# Patient Record
Sex: Female | Born: 1995 | Race: White | Hispanic: No | Marital: Single | Smoking: Former smoker
Health system: Southern US, Community
[De-identification: ages and names within clinical notes are randomized; demographics above are authoritative.]

## PROBLEM LIST (undated history)

## (undated) HISTORY — PX: FOOT SURGERY: SHX648

---
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
## 2017-01-06 ENCOUNTER — Other Ambulatory Visit: Payer: Medicaid Other

## 2017-01-14 ENCOUNTER — Ambulatory Visit: Payer: Medicaid Other | Admitting: Family Medicine

## 2017-02-07 ENCOUNTER — Other Ambulatory Visit: Payer: Self-pay | Admitting: Family Medicine

## 2017-02-08 NOTE — Telephone Encounter (Signed)
Can we schedule her for follow up regarding mood?

## 2017-02-09 NOTE — Telephone Encounter (Signed)
Spoke with patient and she is aware of script being sent to the pharmacy but states that her medicaid has expired and unsure of when she will get it back.  Patient will keep [place] updated on her insurance.  Marissa Wright,CMA

## 2017-10-10 ENCOUNTER — Encounter: Payer: Self-pay | Admitting: Family Medicine

## 2017-10-10 ENCOUNTER — Other Ambulatory Visit: Payer: Self-pay

## 2017-10-10 ENCOUNTER — Ambulatory Visit (INDEPENDENT_AMBULATORY_CARE_PROVIDER_SITE_OTHER): Payer: Self-pay | Admitting: Family Medicine

## 2017-10-10 VITALS — BP 92/60 | HR 69 | Temp 98.1°F | Wt 109.0 lb

## 2017-10-10 DIAGNOSIS — Z1159 Encounter for screening for other viral diseases: Secondary | ICD-10-CM

## 2017-10-10 DIAGNOSIS — Z789 Other specified health status: Secondary | ICD-10-CM

## 2017-10-10 DIAGNOSIS — IMO0001 Reserved for inherently not codable concepts without codable children: Secondary | ICD-10-CM | POA: Insufficient documentation

## 2017-10-10 LAB — POCT URINE PREGNANCY: PREG TEST UR: NEGATIVE

## 2017-10-10 MED ORDER — IBUPROFEN 800 MG PO TABS: 800.0000 mg | ORAL_TABLET | Freq: Three times a day (TID) | ORAL | 3 refills | Status: DC | PRN

## 2017-10-10 MED ORDER — TETANUS-DIPHTH-ACELL PERTUSSIS 5-2-15.5 LF-MCG/0.5 IM SUSP: 0.5000 mL | Freq: Once | INTRAMUSCULAR | 0 refills | Status: AC

## 2017-10-10 MED ORDER — DESOGESTREL-ETHINYL ESTRADIOL 0.15-30 MG-MCG PO TABS: 1.0000 | ORAL_TABLET | Freq: Every day | ORAL | 0 refills | Status: DC

## 2017-10-10 NOTE — Progress Notes (Signed)
    Subjective:    Patient ID: Marissa Wright, female    DOB: [birth year], 22 y.o.   MRN: [id number]   CC: restart birth control  HPI: Patient coming in today to restart birth control for severe menstrual cramping. She would like to reduce the amount of cramping and pain she has with each period. She tried sprintec last but had significant nausea and mood swings with this medication. Her health insurance lapsed and she was unable to follow up.   She is no longer taking any psych medications and is not interested in restarting these at this time.   Denies pelvic pain, vaginal bleeding, vaginal discharge.   Smoking status reviewed- non-smoker  Review of Systems- see HPI   Objective:  BP 92/60   Pulse 69   Temp 98.1 F (36.7 C) (Oral)   Wt 109 lb (49.4 kg)   LMP 09/30/2017   SpO2 99%   BMI 19.31 kg/m  Vitals and nursing note reviewed  General: well nourished, in no acute distress HEENT: normocephalic, MMM Cardiac: RRR, clear S1 and S2, no murmurs, rubs, or gallops Respiratory: clear to auscultation bilaterally, no increased work of breathing Extremities: no edema or cyanosis. Neuro: alert and oriented, no focal deficits   Assessment & Plan:    Birth control  Upreg negative. rx sent in for desogestrel-ethinyl estradiol at lower dose of estradiol to hopefully decrease nausea side effect. Asked patient to take with food. She can take continuously, skip placebo week to limit amount of periods. Asked her to call if she has side effects with this medication. Could try other formulation or another route of birth control in future. Ibuprofen refilled for cramping.  Screening for viral disease  Due for HIV screening, will obtain today.    Return as needed.   Lucila Maine, DO Family Medicine Resident PGY-3

## 2017-10-10 NOTE — Assessment & Plan Note (Signed)
  Upreg negative. rx sent in for desogestrel-ethinyl estradiol at lower dose of estradiol to hopefully decrease nausea side effect. Asked patient to take with food. She can take continuously, skip placebo week to limit amount of periods. Asked her to call if she has side effects with this medication. Could try other formulation or another route of birth control in future. Ibuprofen refilled for cramping.

## 2017-10-10 NOTE — Patient Instructions (Addendum)
It was great seeing you today!  Please let me know if you tolerate the new birth control medication ok and I'll send in refills. If not there are other forms of the pill we can try or other forms of birth control we could use as well.  If you have questions or concerns please do not hesitate to call at [phone].  Lucila Maine, DO PGY-3, [facility] 10/10/2017 2:48 PM   Hormonal Contraception Information Hormonal contraception is a type of birth control that uses hormones to prevent pregnancy. It usually involves a combination of the hormones estrogen and progesterone or only the hormone progesterone. Hormonal contraception works in these ways:  It thickens the mucus in the cervix, making it harder for sperm to enter the uterus.  It changes the lining of the uterus, making it harder for an egg to implant.  It may stop the ovaries from releasing eggs (ovulation). Some women who take hormonal contraceptives that contain only progesterone may continue to ovulate.  Hormonal contraception cannot prevent sexually transmitted infections (STIs). Pregnancy may still occur. Estrogen and progesterone contraceptives Contraceptives that use a combination of estrogen and progesterone are available in these forms:  Pill. Pills come in different combinations of hormones. They must be taken at the same time each day. Pills can affect your period, causing you to get your period once every three months or not at all.  Patch. The patch must be worn on the lower abdomen for three weeks and then removed on the fourth.  Vaginal ring. The ring is placed in the vagina and left there for three weeks. It is then removed for one week.  Progesterone contraceptives Contraceptives that use progesterone only are available in these forms:  Pill. Pills should be taken every day of the cycle.  Intrauterine device (IUD). This device is inserted into the uterus and removed or replaced every five  years or sooner.  Implant. Plastic rods are placed under the skin of the upper arm. They are removed or replaced every three years or sooner.  Injection. The injection is given once every 90 days.  What are the side effects? The side effects of estrogen and progesterone contraceptives include:  Nausea.  Headaches.  Breast tenderness.  Bleeding or spotting between menstrual cycles.  High blood pressure (rare).  Strokes, heart attacks, or blood clots (rare)  Side effects of progesterone-only contraceptives include:  Nausea.  Headaches.  Breast tenderness.  Unpredictable menstrual bleeding.  High blood pressure (rare).  Talk to your health care provider about what side effects may affect you. Where to find more information:  Ask your health care provider for more information and resources about hormonal contraception.  U.S. Department of Health and Programmer, systems on Women's Health: [place] Questions to ask:  What type of hormonal contraception is right for me?  How long should I plan to use hormonal contraception?  What are the side effects of the hormonal contraception method I choose?  How can I prevent STIs while using hormonal contraception? Contact a health care provider if:  You start taking hormonal contraceptives and you develop persistent or severe side effects. Summary  Estrogen and progesterone are hormones used in many forms of birth control.  Talk to your health care provider about what side effects may affect you.  Hormonal contraception cannot prevent sexually transmitted infections (STIs).  Ask your health care provider for more information and resources about hormonal contraception. This information is not intended to replace advice  given to you by your health care provider. Make sure you discuss any questions you have with your health care provider. Document Released: 01/17/2007 Document Revised: 11/28/2015 Document  Reviewed: 11/28/2015 Elsevier Interactive Patient Education  [employer].

## 2017-10-10 NOTE — Assessment & Plan Note (Signed)
  Due for HIV screening, will obtain today.

## 2017-10-11 ENCOUNTER — Encounter: Payer: Self-pay | Admitting: *Deleted

## 2017-10-11 LAB — HIV ANTIBODY (ROUTINE TESTING W REFLEX): HIV Screen 4th Generation wRfx: NONREACTIVE

## 2017-10-11 NOTE — Progress Notes (Signed)
Please inform patient HIV screen negative.

## 2017-12-29 ENCOUNTER — Other Ambulatory Visit: Payer: Self-pay | Admitting: Family Medicine

## 2018-03-30 ENCOUNTER — Other Ambulatory Visit: Payer: Self-pay | Admitting: Family Medicine

## 2018-06-18 ENCOUNTER — Other Ambulatory Visit: Payer: Self-pay | Admitting: Family Medicine

## 2019-03-09 ENCOUNTER — Other Ambulatory Visit: Payer: Self-pay

## 2019-03-09 ENCOUNTER — Ambulatory Visit (INDEPENDENT_AMBULATORY_CARE_PROVIDER_SITE_OTHER): Payer: 59 | Admitting: Family Medicine

## 2019-03-09 ENCOUNTER — Encounter: Payer: Self-pay | Admitting: Family Medicine

## 2019-03-09 VITALS — BP 92/60 | HR 90 | Temp 98.6°F | Wt 113.0 lb

## 2019-03-09 DIAGNOSIS — Z30018 Encounter for initial prescription of other contraceptives: Secondary | ICD-10-CM | POA: Diagnosis not present

## 2019-03-09 DIAGNOSIS — L03317 Cellulitis of buttock: Secondary | ICD-10-CM | POA: Diagnosis not present

## 2019-03-09 DIAGNOSIS — L300 Nummular dermatitis: Secondary | ICD-10-CM | POA: Diagnosis not present

## 2019-03-09 DIAGNOSIS — L0390 Cellulitis, unspecified: Secondary | ICD-10-CM | POA: Insufficient documentation

## 2019-03-09 LAB — POCT URINE PREGNANCY: Preg Test, Ur: NEGATIVE

## 2019-03-09 MED ORDER — DESOGESTREL-ETHINYL ESTRADIOL 0.15-30 MG-MCG PO TABS: 1.0000 | ORAL_TABLET | Freq: Every day | ORAL | 3 refills | Status: DC

## 2019-03-09 MED ORDER — MUPIROCIN 2 % EX OINT: 1.0000 "application " | TOPICAL_OINTMENT | Freq: Two times a day (BID) | CUTANEOUS | 0 refills | Status: DC

## 2019-03-09 MED ORDER — TRIAMCINOLONE ACETONIDE 0.1 % EX OINT: 1.0000 "application " | TOPICAL_OINTMENT | Freq: Two times a day (BID) | CUTANEOUS | 0 refills | Status: DC

## 2019-03-09 NOTE — Assessment & Plan Note (Signed)
Assessment: Small area of erythema surrounding central pimple-like lesion present on left buttock for few days.  Patient without systemic symptoms and lesion without signs of fluid collection/abscess. Plan: -Recommended patient/patient mother draw a line around the exterior of the lesion to make sure it is not spreading and continue to monitor for any fluid collections. -We will prescribe Bactroban which patient can use if lesion seems to be more painful or worsening -Return precautions provided

## 2019-03-09 NOTE — Progress Notes (Signed)
   Subjective:    Patient ID: Marissa Wright, female    DOB: 10/06/1995, 24 y.o.   MRN: [id number]   CC: Refill contraception/rash  HPI:  Contraception: Patient states that she was on OCPs primarily for painful menses and acne over the past several months.  She unfortunately ran out of these about a month ago.  Patient presents with her mother who both states that the patient is not currently sexually active as her boyfriend is in jail.  Patient states that at this time she primarily would like to restart her previous OCP as she feels like her acne is worse over this past month after not using it and that she often has painful periods.  Patient denies history of migraines.  Patient states that she does not have any difficulty taking medication on a regular basis and does not worry about potentially forgetting to take her medication.     Rash: Scaly rash present on inferior aspect of right arm since last Saturday.  Patient states she has a history of eczema and has previously had similar lesions on her other limbs that sometimes take several months to resolve.  She denies pain but does state this lesion is itchy.  She has not currently tried anything to help improve this rash at this time. Patient also states she has a area on her left hip that is erythematous and somewhat painful that started after switching from sweatpants to jeans which are required at her new job.  She presents with a photo of this lesion and states that there is no signs of fluctuance/drainage/abscess or fluid collection.  Denies chills or fevers.   ROS: pertinent noted in the HPI   Pertinent PMH, PSH, FH, SoHx: Hx of eczema  Objective:  BP 92/60   Pulse 90   Temp 98.6 F (37 C) (Oral)   Wt 113 lb (51.3 kg)   LMP 02/11/2019   SpO2 98%   BMI 20.02 kg/m   Vitals and nursing note reviewed  General: NAD, pleasant, able to participate in exam Skin/extremities: Scaly rash present on inferior aspect of right upper arm  approximately 2 cm x 2 cm.  Patient with photo of erythematous lesion from left buttock with central raised area similar to a pimple.  Psych: Normal affect and mood   Assessment & Plan:    Nummular eczema Patient with a history of eczema with new lesion on left inferior arm.  KOH negative. Plan: -We will prescribe triamcinolone 0.1% ointment  Cellulitis Assessment: Small area of erythema surrounding central pimple-like lesion present on left buttock for few days.  Patient without systemic symptoms and lesion without signs of fluid collection/abscess. Plan: -Recommended patient/patient mother draw a line around the exterior of the lesion to make sure it is not spreading and continue to monitor for any fluid collections. -We will prescribe Bactroban which patient can use if lesion seems to be more painful or worsening -Return precautions provided  Contraception management Plan: -Refilled patient's OCPs -Pregnancy test negative     Lurline Del, [facility] PGY-1

## 2019-03-09 NOTE — Patient Instructions (Addendum)
It was great to see you!  Our plans for today:  - I provided a refill for your birth control medication. -For your eczema I am prescribing a triamcinolone ointment.  You can use this twice per day on the rash on your arm. -For the rash on your hip I recommend simply drawing a line around the age of the redness and monitor it for any pus or swelling.  I am prescribing some Bactroban ointment which he can use on this area if it does not seem to be improving over the next 1-2 days.  Take care and seek immediate care sooner if you develop any concerns.   Dr[facility]

## 2019-03-09 NOTE — Assessment & Plan Note (Signed)
Patient with a history of eczema with new lesion on left inferior arm.  KOH negative. Plan: -We will prescribe triamcinolone 0.1% ointment

## 2019-03-09 NOTE — Assessment & Plan Note (Signed)
Plan: -Refilled patient's OCPs -Pregnancy test negative

## 2019-03-16 ENCOUNTER — Ambulatory Visit (INDEPENDENT_AMBULATORY_CARE_PROVIDER_SITE_OTHER): Payer: 59 | Admitting: Family Medicine

## 2019-03-16 ENCOUNTER — Telehealth: Payer: Self-pay | Admitting: Family Medicine

## 2019-03-16 ENCOUNTER — Other Ambulatory Visit (HOSPITAL_COMMUNITY)
Admission: RE | Admit: 2019-03-16 | Discharge: 2019-03-16 | Disposition: A | Discharge status: Home / Self Care | Payer: 59 | Source: Ambulatory Visit | Attending: Family Medicine | Admitting: Family Medicine

## 2019-03-16 ENCOUNTER — Other Ambulatory Visit: Payer: Self-pay

## 2019-03-16 ENCOUNTER — Other Ambulatory Visit: Payer: Self-pay | Admitting: Family Medicine

## 2019-03-16 VITALS — BP 110/58 | HR 87 | Ht 63.0 in | Wt 115.0 lb

## 2019-03-16 DIAGNOSIS — F39 Unspecified mood [affective] disorder: Secondary | ICD-10-CM | POA: Diagnosis not present

## 2019-03-16 DIAGNOSIS — Z202 Contact with and (suspected) exposure to infections with a predominantly sexual mode of transmission: Secondary | ICD-10-CM | POA: Diagnosis not present

## 2019-03-16 DIAGNOSIS — Z124 Encounter for screening for malignant neoplasm of cervix: Secondary | ICD-10-CM | POA: Diagnosis present

## 2019-03-16 DIAGNOSIS — N898 Other specified noninflammatory disorders of vagina: Secondary | ICD-10-CM | POA: Insufficient documentation

## 2019-03-16 DIAGNOSIS — Z114 Encounter for screening for human immunodeficiency virus [HIV]: Secondary | ICD-10-CM

## 2019-03-16 DIAGNOSIS — Z113 Encounter for screening for infections with a predominantly sexual mode of transmission: Secondary | ICD-10-CM

## 2019-03-16 DIAGNOSIS — Z01419 Encounter for gynecological examination (general) (routine) without abnormal findings: Secondary | ICD-10-CM

## 2019-03-16 LAB — POCT WET PREP (WET MOUNT)
Clue Cells Wet Prep Whiff POC: NEGATIVE
Trichomonas Wet Prep HPF POC: ABSENT

## 2019-03-16 NOTE — Progress Notes (Signed)
SUBJECTIVE:   CHIEF COMPLAINT / HPI:  Vaginal Discharge The patient's primary symptoms include vaginal discharge. The patient's pertinent negatives include no genital itching or vaginal bleeding. Primary symptoms comment: On going for a few days, but she feels this is normal discharge. She is more concern about bumps on her vulva which has been there for a month. Episode onset: Bumps started about 1 month ago, but she noticed normal vaginal discharge few days ago. The problem has been unchanged. She is not pregnant (LMP was last week in january. She is on OCP for birth control and she said she had a negative pregnancy test 1 week ago.). Pertinent negatives include no abdominal pain, constipation, diarrhea, discolored urine, dysuria, fever, flank pain, frequency or vomiting. The vaginal discharge was normal. There has been no bleeding. Nothing aggravates the symptoms. She has tried nothing for the symptoms. Sexual activity: Last sexual activity was a year ago. She uses oral contraceptives for contraception. Her menstrual history has been regular.  Anxiety/Depression: No current suicidal thought. Last thought was 1 week. No previous suicide attempt. She does not think she will ever kill herself. She has SI only when she is having a bad day. SI thought is less frequent like less than once every 3-6 months. She has hx of anxiety. She had never been on meds and prefers not to be on meds.  PERTINENT  PMH / PSH: PMX reviewed.  OBJECTIVE:   BP (!) 110/58   Pulse 87   Ht '5\' 3"'$  (1.6 m)   Wt 115 lb (52.2 kg)   LMP 02/11/2019   SpO2 99%   BMI 20.37 kg/m    Physical Exam Vitals and nursing note reviewed. Exam conducted with a chaperone present Cherrie Distance Leggette chaperoned).  Cardiovascular:     Rate and Rhythm: Normal rate and regular rhythm.     Heart sounds: Normal heart sounds. No murmur.  Pulmonary:     Effort: Pulmonary effort is normal. No respiratory distress.     Breath sounds: Normal  breath sounds. No wheezing.  Abdominal:     General: Abdomen is flat. Bowel sounds are normal.     Palpations: Abdomen is soft.     Tenderness: There is no abdominal tenderness.  Genitourinary:    Vagina: No erythema or bleeding.     Cervix: No cervical motion tenderness.     Uterus: Normal.      Adnexa: Right adnexa normal and left adnexa normal.       Comments: Normal color vaginal discharge Neurological:     Mental Status: She is alert.  Psychiatric:        Attention and Perception: Attention normal.        Mood and Affect: Mood normal.        Speech: Speech normal.        Behavior: Behavior normal.        Thought Content: Thought content normal.        Judgment: Judgment normal.    GAD score: 17    Office Visit from 03/16/2019 in [place]  PHQ-9 Total Score  15       ASSESSMENT/PLAN:   Encounter for gynecological examination She is due for PAP. Gyn exam normal. GC/Chlamydia collected with her PAP. I will contact her with the result.  Vaginal lesion Vaginal discharge appears normal. Wet prep completed. HSV swab done and ordered with PAP given tiny bumps. As discussed with her, the bumps looks like ingrown hair  which will improve with avoiding shaving. She requested STD screen although she is not sexually active. HIV,RPR, GC/Chlamydia checked. I will contact with all test results.  Note: LMP was more than 1 month ago. Upreg recommended. However, she said she had a recent neg text. I reviewed her report, this is true. She will return soon for f/u if she still does not have her period.  Mood disorder ([place]) Currently, she is not suicidal. Hx of anxiety without treatment. I offered both pharmacologic treatment and therapy, but she declined both. She said she is fine and dealing with this by herself. I consulted with Dr. Hartford Poli, who met with the patient today. Return precaution discussed. She is safe for home d/c with no concern for harm  to herself or others at the moment.    More than 50% of this 35 minute face to face encounter was spent on counseling and coordination of care with [facility].  Andrena Mews, MD [place]

## 2019-03-16 NOTE — Assessment & Plan Note (Signed)
Vaginal discharge appears normal. Wet prep completed. HSV swab done and ordered with PAP given tiny bumps. As discussed with her, the bumps looks like ingrown hair which will improve with avoiding shaving. She requested STD screen although she is not sexually active. HIV,RPR, GC/Chlamydia checked. I will contact with all test results.  Note: LMP was more than 1 month ago. Upreg recommended. However, she said she had a recent neg text. I reviewed her report, this is true. She will return soon for f/u if she still does not have her period.

## 2019-03-16 NOTE — Assessment & Plan Note (Signed)
Currently, she is not suicidal. Hx of anxiety without treatment. I offered both pharmacologic treatment and therapy, but she declined both. She said she is fine and dealing with this by herself. I consulted with Dr. Hartford Poli, who met with the patient today. Return precaution discussed. She is safe for home d/c with no concern for harm to herself or others at the moment.

## 2019-03-16 NOTE — Progress Notes (Signed)
I was consulted by Dr. Gwendlyn Deutscher for positive PHQ-9 Pt verbally consented to speaking with me today.  S: Pt reported she experiences anxiety more than depression.  Pt reported she has been trying to push herself more to do things like get a new job and has been successful. Pt stated she tries to deal with her mental health on her own.  Pt reported she does have thoughts of SI and sometimes thinks about taking pills.  Pt denied intent.    Pt reported she took medication for her anxiety before and did not feel it worked.  Pt reports depression runs in her family.     Pt and Dr. Hartford Poli talked about coping strategies for anxiety such as mindfulness.  O: Pt engaged during conversation. Pt oriented X4.  Pt scored 15 on PHQ-9 with positive on question 9.  A: Pt would benefit from coping strategies and therapy in the future.  P: Patient was given suicide hotline number and encouraged to call [facility] if interested in therapy with Dr. Hartford Poli

## 2019-03-16 NOTE — Assessment & Plan Note (Signed)
She is due for PAP. Gyn exam normal. GC/Chlamydia collected with her PAP. I will contact her with the result.

## 2019-03-16 NOTE — Telephone Encounter (Signed)
I discussed wet prep result with her. It also appears that she did not go to the lab for HIV and RPR test. She will call to schedule lab appointment when she is able to come in. As discussed with her, I have put a future order for this. She verbalized understanding.

## 2019-03-16 NOTE — Patient Instructions (Signed)
Suicidal Feelings: How to Help Yourself Suicide is when you end your own life. There are many things you can do to help yourself feel better when struggling with these feelings. Many services and people are available to support you and others who struggle with similar feelings.  If you ever feel like you may hurt yourself or others, or have thoughts about taking your own life, get help right away. To get help:  Call your local emergency services (911 in the [place]  The [employer]'s health and human services helpline (211 in the [place]  Go to your nearest emergency department.  Call a suicide hotline to speak with a trained counselor. The following suicide hotlines are available in the [place]: ? 1-800-273-TALK (1-800-273-8255). ? 1-800-SUICIDE (1-800-784-2433). ? 1-888-628-9454. This is a hotline for Spanish speakers. ? 1-800-799-4889. This is a hotline for TTY users. ? 1-866-4-U-TREVOR (1-866-488-7386). This is a hotline for lesbian, gay, bisexual, transgender, or questioning youth. ? For a list of hotlines in [place], visit www.suicide.org/hotlines/international/[place]-suicide-hotlines.html  Contact a crisis center or a local suicide prevention center. To find a crisis center or suicide prevention center: ? Call your local hospital, clinic, community service organization, mental health center, social service provider, or health department. Ask for help with connecting to a crisis center. ? For a list of crisis centers in the [place], visit: suicidepreventionlifeline.org ? For a list of crisis centers in [place], visit: suicideprevention.ca How to help yourself feel better   Promise yourself that you will not do anything extreme when you have suicidal feelings. Remember, there is hope. Many people have gotten through suicidal thoughts and feelings, and you can too. If you have had these feelings before, remind yourself that you can get through them again.  Let family, friends,  teachers, or counselors know how you are feeling. Try not to separate yourself from those who care about you and want to help you. Talk with someone every day, even if you do not feel sociable. Face-to-face conversation is best to help them understand your feelings.  Contact a mental health care provider and work with this person regularly.  Make a safety plan that you can follow during a crisis. Include phone numbers of suicide prevention hotlines, mental health professionals, and trusted friends and family members you can call during an emergency. Save these numbers on your phone.  If you are thinking of taking a lot of medicine, give your medicine to someone who can give it to you as prescribed. If you are on antidepressants and are concerned you will overdose, tell your health care provider so that he or she can give you safer medicines.  Try to stick to your routines. Follow a schedule every day. Make self-care a priority.  Make a list of realistic goals, and cross them off when you achieve them. Accomplishments can give you a sense of worth.  Wait until you are feeling better before doing things that you find difficult or unpleasant.  Do things that you have always enjoyed to take your mind off your feelings. Try reading a book, or listening to or playing music. Spending time outside, in nature, may help you feel better. Follow these instructions at home:   Visit your primary health care provider every year for a checkup.  Work with a mental health care provider as needed.  Eat a well-balanced diet, and eat regular meals.  Get plenty of rest.  Exercise if you are able. Just 30 minutes of exercise each day can   help you feel better.  Take over-the-counter and prescription medicines only as told by your health care provider. Ask your mental health care provider about the possible side effects of any medicines you are taking.  Do not use alcohol or drugs, and remove these substances  from your home.  Remove weapons, poisons, knives, and other deadly items from your home. General recommendations  Keep your living space well lit.  When you are feeling well, write yourself a letter with tips and support that you can read when you are not feeling well.  Remember that life's difficulties can be sorted out with help. Conditions can be treated, and you can learn behaviors and ways of thinking that will help you. Where to find more information  National Suicide Prevention Lifeline: www.suicidepreventionlifeline.org  Hopeline: www.hopeline.com  American Foundation for Suicide Prevention: www.afsp.org  The Trevor Project (for lesbian, gay, bisexual, transgender, or questioning youth): www.thetrevorproject.org Contact a health care provider if:  You feel as though you are a burden to others.  You feel agitated, angry, vengeful, or have extreme mood swings.  You have withdrawn from family and friends. Get help right away if:  You are talking about suicide or wishing to die.  You start making plans for how to commit suicide.  You feel that you have no reason to live.  You start making plans for putting your affairs in order, saying goodbye, or giving your possessions away.  You feel guilt, shame, or unbearable pain, and it seems like there is no way out.  You are frequently using drugs or alcohol.  You are engaging in risky behaviors that could lead to death. If you have any of these symptoms, get help right away. Call emergency services, go to your nearest emergency department or crisis center, or call a suicide crisis helpline. Summary  Suicide is when you take your own life.  Promise yourself that you will not do anything extreme when you have suicidal feelings.  Let family, friends, teachers, or counselors know how you are feeling.  Get help right away if you feel as though life is getting too tough to handle and you are thinking about suicide. This  information is not intended to replace advice given to you by your health care provider. Make sure you discuss any questions you have with your health care provider. Document Revised: 04/20/2018 Document Reviewed: 08/10/2016 Elsevier Patient Education  2020 Elsevier Inc.  

## 2019-03-16 NOTE — Addendum Note (Signed)
Addended by: Katharina Caper, Swade Shonka D on: 03/16/2019 05:25 PM   Modules accepted: Orders

## 2019-03-19 LAB — SPECIMEN STATUS REPORT

## 2019-03-20 LAB — HERPES SIMPLEX VIRUS CULTURE

## 2019-03-22 ENCOUNTER — Telehealth: Payer: Self-pay | Admitting: Family Medicine

## 2019-03-22 LAB — CYTOLOGY - PAP
Chlamydia: NEGATIVE
Comment: NEGATIVE
Comment: NEGATIVE
Comment: NORMAL
Diagnosis: NEGATIVE
HSV1: NEGATIVE
HSV2: NEGATIVE
Neisseria Gonorrhea: NEGATIVE

## 2019-03-22 NOTE — Telephone Encounter (Signed)
Test result discussed. She is reminded to schedule lab appointment for HIV and RPR testing. She has not further questions.

## 2019-06-08 ENCOUNTER — Other Ambulatory Visit: Payer: Self-pay

## 2019-06-08 ENCOUNTER — Other Ambulatory Visit (HOSPITAL_COMMUNITY)
Admission: RE | Admit: 2019-06-08 | Discharge: 2019-06-08 | Disposition: A | Discharge status: Home / Self Care | Payer: 59 | Source: Ambulatory Visit | Attending: Family Medicine | Admitting: Family Medicine

## 2019-06-08 ENCOUNTER — Ambulatory Visit (INDEPENDENT_AMBULATORY_CARE_PROVIDER_SITE_OTHER): Payer: 59 | Admitting: Family Medicine

## 2019-06-08 VITALS — BP 102/62 | HR 94 | Temp 98.6°F | Wt 116.0 lb

## 2019-06-08 DIAGNOSIS — N898 Other specified noninflammatory disorders of vagina: Secondary | ICD-10-CM | POA: Diagnosis present

## 2019-06-08 DIAGNOSIS — B3731 Acute candidiasis of vulva and vagina: Secondary | ICD-10-CM | POA: Insufficient documentation

## 2019-06-08 DIAGNOSIS — F320 Major depressive disorder, single episode, mild: Secondary | ICD-10-CM

## 2019-06-08 DIAGNOSIS — B373 Candidiasis of vulva and vagina: Secondary | ICD-10-CM

## 2019-06-08 HISTORY — DX: Major depressive disorder, single episode, mild: F32.0

## 2019-06-08 LAB — POCT WET PREP (WET MOUNT)
Clue Cells Wet Prep Whiff POC: NEGATIVE
Trichomonas Wet Prep HPF POC: ABSENT

## 2019-06-08 MED ORDER — FLUCONAZOLE 150 MG PO TABS: 150.0000 mg | ORAL_TABLET | Freq: Once | ORAL | 0 refills | Status: AC

## 2019-06-08 NOTE — Progress Notes (Signed)
    SUBJECTIVE:   CHIEF COMPLAINT / HPI:   Vaginal discharge: Patient reports many days of vaginal burning and itching with associated agitation and feeling of "swelling".  She describes her vaginal discharge as 7 with a yellow tent.  She used Monistat over-the-counter vaginal yeast infection medication yesterday and feels a little bit better today.  She denies any fevers, chest pain, shortness of breath, chills, body aches, abdominal pain, or vaginal odors.  Would also like gonorrhea and Chlamydia testing.  Depression: patient scored 8 on PHQ-9 during today's visit, scored 0 on question 9.  Reports that she has a history of anxiety and depression and used to be on medications for such.  She is not currently on any medications for anxiety or depression now.  She has a strong family history on her mother side for anxiety, depression, and bipolar disorder.  She denies any SI or HI, confirm she is safe to herself and others.  PERTINENT  PMH / PSH:  Anxiety/depression Sexually active female   OBJECTIVE:   BP 102/62   Pulse 94   Temp 98.6 F (37 C) (Oral)   Wt 116 lb (52.6 kg)   LMP  (LMP Unknown)   SpO2 98%   BMI 20.55 kg/m    Physical exam: GU: Normal-appearing labia majora, erythema and edema appreciated to bilateral labia minora, normal appearing vaginal rugae with white discharge appreciated, normal-appearing cervix without lesion or bleeding    Office Visit from 06/08/2019 in [place]  PHQ-9 Total Score  8      ASSESSMENT/PLAN:   Vaginal candidiasis Wet prep performed today, with positive KOH and yeast, moderate bacteria, negative whiff and negative clue cells. -Treat with Diflucan 150 mg tablet x1  Depression, major, single episode, mild (HCC) Patient's PHQ-9 today is an 8, answered 0 on question #9.  Reports she is safe to herself and others. -At this time is respectfully declining any counseling or medication for depression -Was given resources  she can reach out to regarding behavioral health support -Was also encouraged to give our clinic a call should she find herself in a negative headspace, or if it severe enough to go to the emergency department.    [place]

## 2019-06-08 NOTE — Assessment & Plan Note (Signed)
Patient's PHQ-9 today is an 8, answered 0 on question #9.  Reports she is safe to herself and others. -At this time is respectfully declining any counseling or medication for depression -Was given resources she can reach out to regarding behavioral health support -Was also encouraged to give our clinic a call should she find herself in a negative headspace, or if it severe enough to go to the emergency department.

## 2019-06-08 NOTE — Assessment & Plan Note (Signed)
Wet prep performed today, with positive KOH and yeast, moderate bacteria, negative whiff and negative clue cells. -Treat with Diflucan 150 mg tablet x1

## 2019-06-12 LAB — CERVICOVAGINAL ANCILLARY ONLY
Chlamydia: NEGATIVE
Comment: NEGATIVE
Comment: NORMAL
Neisseria Gonorrhea: NEGATIVE

## 2019-07-10 ENCOUNTER — Other Ambulatory Visit: Payer: Self-pay | Admitting: Family Medicine

## 2019-08-29 ENCOUNTER — Encounter: Payer: Self-pay | Admitting: Family Medicine

## 2019-08-29 ENCOUNTER — Other Ambulatory Visit: Payer: Self-pay

## 2019-08-29 ENCOUNTER — Other Ambulatory Visit (HOSPITAL_COMMUNITY)
Admission: RE | Admit: 2019-08-29 | Discharge: 2019-08-29 | Disposition: A | Discharge status: Home / Self Care | Payer: 59 | Source: Ambulatory Visit | Attending: Family Medicine | Admitting: Family Medicine

## 2019-08-29 ENCOUNTER — Ambulatory Visit (INDEPENDENT_AMBULATORY_CARE_PROVIDER_SITE_OTHER): Payer: 59 | Admitting: Family Medicine

## 2019-08-29 VITALS — BP 98/60 | HR 93 | Wt 111.6 lb

## 2019-08-29 DIAGNOSIS — Z113 Encounter for screening for infections with a predominantly sexual mode of transmission: Secondary | ICD-10-CM | POA: Insufficient documentation

## 2019-08-29 DIAGNOSIS — B9689 Other specified bacterial agents as the cause of diseases classified elsewhere: Secondary | ICD-10-CM

## 2019-08-29 DIAGNOSIS — N760 Acute vaginitis: Secondary | ICD-10-CM | POA: Diagnosis not present

## 2019-08-29 LAB — POCT WET PREP (WET MOUNT)
Clue Cells Wet Prep Whiff POC: POSITIVE
Trichomonas Wet Prep HPF POC: ABSENT

## 2019-08-29 MED ORDER — METRONIDAZOLE 500 MG PO TABS: 500.0000 mg | ORAL_TABLET | Freq: Two times a day (BID) | ORAL | 0 refills | Status: AC

## 2019-08-29 NOTE — Assessment & Plan Note (Signed)
Present on wet prep and consistent with clinical presentation.  Rx Flagyl BID x 7 days.  Counseled on adequate hydration, avoiding tightfitting clothing/thong underwear, and sensitive soaps such as Dove (if needed) in vaginal region.  Encouraged barrier protection use when sexually active and recommended considering restarting contraception in the near future.  Will follow up additional STD screen as above.

## 2019-08-29 NOTE — Progress Notes (Signed)
    SUBJECTIVE:   CHIEF COMPLAINT / HPI: Vaginal discharge/STD testing  Marissa Wright is a 24 year old female presenting for evaluation of vaginal discharge.  Has noticed increased vaginal discharge with odor for the past few weeks.  Fishy at times, notices mild worse after sexual activity 1 month ago.  She tried a few days of vaginal boric acid with some improvement but returned shortly after discontinuing therapy.  Some vaginal irritation.  She denies any concurrent dysuria, fever, back pain.  Occasionally has had sharp suprapubic abdominal pains here and there.  No known exposures to STDs.  Has not had this before.  Contraception: None, discontinued OCPs 1 month ago since she was no longer sexually active.  Sexually active: Last >1 month ago, did not use barrier protection. Previous STD: None known. LMP: First week of August.  PERTINENT  PMH / PSH: Mood disorder, previous vaginal candidiasis  OBJECTIVE:   BP 98/60   Pulse 93   Wt 111 lb 9.6 oz (50.6 kg)   SpO2 98%   BMI 19.77 kg/m   General: Alert, NAD HEENT: NCAT, MMM Lungs: No increased WOB  Abdomen: soft, non-tender Ext: Warm, dry Pelvic exam: VULVA: normal appearing vulva with no masses, tenderness or lesions, VAGINA: vaginal discharge - white, copious and creamy, CERVIX: normal appearing cervix without discharge or lesions, cervical motion tenderness absent, UTERUS: uterus is normal size, shape, consistency and nontender, ADNEXA: normal adnexa in size, nontender and no masses.  Chaperoned by CMA.  ASSESSMENT/PLAN:   Screening examination for STD (sexually transmitted disease) Associated vaginal discharge as discussed below.  Will follow up gc/ch, trichomonas, HIV, RPR, and hep C.  Encouraged barrier protection use when sexually active to prevent transmission.  Bacterial vaginosis Present on wet prep and consistent with clinical presentation.  Rx Flagyl BID x 7 days.  Counseled on adequate hydration, avoiding tightfitting  clothing/thong underwear, and sensitive soaps such as Dove (if needed) in vaginal region.  Encouraged barrier protection use when sexually active and recommended considering restarting contraception in the near future.  Will follow up additional STD screen as above.    Follow-up if the above not improving or sooner if worsening, follow-up to discuss contraception.  Patriciaann Clan, [place]

## 2019-08-29 NOTE — Patient Instructions (Signed)
I have sent in Flagyl to help treat bacterial vaginosis, please do not drink any alcohol during use of this medication as this can make you quite nauseous.  Additionally to help prevent further episodes, please make sure that you are staying well-hydrated drinking plenty of water, avoid tightfitting clothing, wear cotton underwear and minimal use of thongs, and avoid douching or using soap within your vaginal canal.   I will follow up with you for the rest your results in the next several days.  Please make sure if you are sexually active again (wait 2 weeks), that you use condoms to help prevent from any sexually transmitted diseases or unwanted pregnancy.  Please consider restarting contraception if active.

## 2019-08-29 NOTE — Assessment & Plan Note (Signed)
Associated vaginal discharge as discussed below.  Will follow up gc/ch, trichomonas, HIV, RPR, and hep C.  Encouraged barrier protection use when sexually active to prevent transmission.

## 2019-08-30 ENCOUNTER — Encounter: Payer: Self-pay | Admitting: Family Medicine

## 2019-08-30 ENCOUNTER — Encounter (HOSPITAL_COMMUNITY): Payer: Self-pay | Admitting: Family Medicine

## 2019-08-30 LAB — HEPATITIS C ANTIBODY: Hep C Virus Ab: <0.1 s/co ratio (ref 0.0–0.9)

## 2019-08-30 LAB — CERVICOVAGINAL ANCILLARY ONLY
Chlamydia: NEGATIVE
Comment: NEGATIVE
Comment: NEGATIVE
Comment: NORMAL
Neisseria Gonorrhea: NEGATIVE
Trichomonas: NEGATIVE

## 2019-08-30 LAB — HIV ANTIBODY (ROUTINE TESTING W REFLEX): HIV Screen 4th Generation wRfx: NONREACTIVE

## 2019-08-30 LAB — RPR: RPR Ser Ql: NONREACTIVE

## 2019-11-17 ENCOUNTER — Other Ambulatory Visit: Payer: Self-pay | Admitting: Family Medicine

## 2019-11-22 ENCOUNTER — Other Ambulatory Visit (HOSPITAL_COMMUNITY)
Admission: RE | Admit: 2019-11-22 | Discharge: 2019-11-22 | Disposition: A | Discharge status: Home / Self Care | Payer: 59 | Source: Ambulatory Visit | Attending: Family Medicine | Admitting: Family Medicine

## 2019-11-22 ENCOUNTER — Other Ambulatory Visit: Payer: Self-pay

## 2019-11-22 ENCOUNTER — Ambulatory Visit (INDEPENDENT_AMBULATORY_CARE_PROVIDER_SITE_OTHER): Payer: 59 | Admitting: Family Medicine

## 2019-11-22 ENCOUNTER — Other Ambulatory Visit: Payer: Self-pay | Admitting: Family Medicine

## 2019-11-22 VITALS — BP 102/64 | HR 92 | Ht 62.0 in | Wt 111.8 lb

## 2019-11-22 DIAGNOSIS — N898 Other specified noninflammatory disorders of vagina: Secondary | ICD-10-CM

## 2019-11-22 DIAGNOSIS — Z3202 Encounter for pregnancy test, result negative: Secondary | ICD-10-CM | POA: Diagnosis not present

## 2019-11-22 DIAGNOSIS — R3915 Urgency of urination: Secondary | ICD-10-CM

## 2019-11-22 DIAGNOSIS — R102 Pelvic and perineal pain: Secondary | ICD-10-CM | POA: Insufficient documentation

## 2019-11-22 LAB — POCT UA - MICROSCOPIC ONLY

## 2019-11-22 LAB — POCT URINALYSIS DIP (MANUAL ENTRY)
Bilirubin, UA: NEGATIVE
Glucose, UA: NEGATIVE mg/dL
Leukocytes, UA: NEGATIVE
Nitrite, UA: NEGATIVE
Protein Ur, POC: NEGATIVE mg/dL
Spec Grav, UA: >=1.03 — AB (ref 1.010–1.025)
Urobilinogen, UA: 1 E.U./dL
pH, UA: 6.5 (ref 5.0–8.0)

## 2019-11-22 LAB — POCT URINE PREGNANCY: Preg Test, Ur: NEGATIVE

## 2019-11-22 MED ORDER — NAPROXEN SODIUM 550 MG PO TABS: 550.0000 mg | ORAL_TABLET | Freq: Two times a day (BID) | ORAL | 0 refills | Status: DC

## 2019-11-22 NOTE — Progress Notes (Signed)
    SUBJECTIVE:   CHIEF COMPLAINT / HPI: Pain  Patient reports that she has been experiencing vaginal pain for 1 week.  She states that she developed this pain on the right side of her labia after vigorous intercourse.  She reports no abnormal vaginal discharge or bleeding.  She states that the area is very tender to touch, painful when trying to sit for long periods of time.  She also reports having some increased urinary urgency since onset of this pain.  She denies any problems with having bowel movements.  She denies any fevers or chills recently.  PERTINENT  PMH / PSH:  Noncontributory  OBJECTIVE:   BP 102/64   Pulse 92   Ht 5\' 2"  (1.575 m)   Wt 111 lb 12.8 oz (50.7 kg)   LMP 10/31/2019 (Approximate)   SpO2 99%   BMI 20.45 kg/m   Physical exam Genitalia:  Normal introitus for age, no external lesions, no vaginal discharge, mucosa pink and moist, initially unable to tolerate speculum exam, bimanual exam with anterior vaginal wall swelling about 2 to 3 cm in diameter, speculum exam letter achieved by Dr. Owens Shark who reported edematous vaginal walls  ASSESSMENT/PLAN:   Vaginal wall cyst Swelling in anterior vaginal wall could be due to cyst or cystocele.  No drainage or bleeding notable on speculum exam today however patient experience intermittent tenderness with exam/visualization. -Referral to gynecology -Urine analysis completed today -Urine pregnancy test completed -We will test for gonorrhea and chlamydia     Eulis Foster, MD [place]

## 2019-11-22 NOTE — Patient Instructions (Addendum)
It was wonderful to see you today.  Please bring ALL of your medications with you to every visit.   Today we talked about:  - Calling Gynecology for an appointment  - Taking prenatal vitamins  - We will call you the results of your testing  - Taken Tylenol 1 tablet every 6 hours --regardless of pain ('standing') and as needed naproxen for pain--this  Is at your pharmacy    Thank you for choosing [facility].   Please call [phone] with any questions about today's appointment.  Please be sure to schedule follow up at the front  desk before you leave today.   Dorris Singh, MD  Family Medicine

## 2019-11-23 LAB — CERVICOVAGINAL ANCILLARY ONLY
Chlamydia: NEGATIVE
Comment: NEGATIVE
Comment: NORMAL
Neisseria Gonorrhea: NEGATIVE

## 2019-11-25 DIAGNOSIS — N898 Other specified noninflammatory disorders of vagina: Secondary | ICD-10-CM | POA: Insufficient documentation

## 2019-11-25 LAB — URINE CULTURE

## 2019-11-25 NOTE — Assessment & Plan Note (Signed)
Swelling in anterior vaginal wall could be due to cyst or cystocele.  No drainage or bleeding notable on speculum exam today however patient experience intermittent tenderness with exam/visualization. -Referral to gynecology -Urine analysis completed today -Urine pregnancy test completed -We will test for gonorrhea and chlamydia

## 2019-12-10 ENCOUNTER — Ambulatory Visit: Payer: 59 | Admitting: Obstetrics and Gynecology

## 2019-12-19 ENCOUNTER — Ambulatory Visit (INDEPENDENT_AMBULATORY_CARE_PROVIDER_SITE_OTHER): Payer: 59 | Admitting: Family Medicine

## 2019-12-19 ENCOUNTER — Other Ambulatory Visit: Payer: Self-pay

## 2019-12-19 VITALS — BP 112/80 | HR 81

## 2019-12-19 DIAGNOSIS — R112 Nausea with vomiting, unspecified: Secondary | ICD-10-CM | POA: Diagnosis not present

## 2019-12-19 DIAGNOSIS — R1110 Vomiting, unspecified: Secondary | ICD-10-CM | POA: Insufficient documentation

## 2019-12-19 MED ORDER — PROCHLORPERAZINE MALEATE 5 MG PO TABS: 5.0000 mg | ORAL_TABLET | Freq: Four times a day (QID) | ORAL | 0 refills | Status: DC | PRN

## 2019-12-19 MED ORDER — ONDANSETRON 4 MG PO TBDP: 4.0000 mg | ORAL_TABLET | Freq: Three times a day (TID) | ORAL | 0 refills | Status: DC | PRN

## 2019-12-19 MED ORDER — ONDANSETRON 4 MG PO TBDP
4.0000 mg | ORAL_TABLET | Freq: Once | ORAL | Status: AC
  Administered 2019-12-19: 4 mg via ORAL

## 2019-12-19 NOTE — Patient Instructions (Addendum)
It was a pleasure to see you today!  Thank you for choosing [facility] for your primary care.  Ermina Oberman was seen for nausea and vomiting.   Our plans for today were:  I have prescribed a medication for you to take to help with your nausea.  Is very important that you remain hydrated and continue to drink fluids.  I recommend slowly introducing solids back into your diet as tolerated.  Examples include dry toast, soup broths or saltine crackers.  If you began to have vomiting that does not respond to the medication and began to become dizzy with standing, you will need care.   If you continue to experience nausea and missed your next menstrual cycle, I recommend taking a home pregnancy test and following up with our office afterwards.   Best Wishes,   Dr. Alba Cory

## 2019-12-19 NOTE — Progress Notes (Signed)
    SUBJECTIVE:   CHIEF COMPLAINT / HPI: nausea and emesis   Patient reports being constantly nauseous and vomitting for 7 days  She has not been able to consistently keep food down due to the nausea/emesis.  She also reports having an upper respiratory infection  She is unaware of fevers  Does report chills  Has a "tickle in throat"  Has been coughing a lot  She was tested for covid and flu both results were negative on 12/15/19 at [facility]  Reports having diarrhea for the first few days  She reports feelings past of dizziness with standing earlier in her illness  Reports some palpitations  Denies similar symptoms in any sick contacts  Emesis looks like clear/yellow liquid without blood  Episodes of emesis have decreased  LMP was 21st of Nov  Had negative home pregnancy test two days ago  Has not been taking OCP regularly and currently sexually active without using barriers   PERTINENT  PMH / PSH:  Non contributory   OBJECTIVE:   BP 112/80   Pulse 81   LMP 12/03/2019   SpO2 98%   Physical exam General: female appearing stated age in no acute distress HEENT: MMM, no oral lesions noted,Neck non-tender without lymphadenopathy Cardio: Normal S1 and S2, no S3 or S4. Rhythm is regular. No murmurs or rubs.  Bilateral radial pulses palpable Pulm: Clear to auscultation bilaterally, no crackles, wheezing, or diminished breath sounds. Abdomen: Bowel sounds normal. Abdomen soft and non-tender during my exam.   ASSESSMENT/PLAN:   Emesis Nausea and emesis with differential including early pregnancy as well as a viral GI bug state.  Patient given 4 mg of ODT Zofran in clinic.  Did not note much difference after medication.  Patient states Phenergan makes her sleepy.  Patient prescribed Compazine 5 mg with precautions to return if vomiting limits her ability to drink fluids.  Patient verbalized understanding and importance of hydration.  Also counseled patient to take home urine pregnancy test  if she misses her next menses.  Patient verbalized understanding.    Eulis Foster, MD [place]

## 2019-12-19 NOTE — Assessment & Plan Note (Signed)
Nausea and emesis with differential including early pregnancy as well as a viral GI bug state.  Patient given 4 mg of ODT Zofran in clinic.  Did not note much difference after medication.  Patient states Phenergan makes her sleepy.  Patient prescribed Compazine 5 mg with precautions to return if vomiting limits her ability to drink fluids.  Patient verbalized understanding and importance of hydration.  Also counseled patient to take home urine pregnancy test if she misses her next menses.  Patient verbalized understanding.

## 2019-12-20 ENCOUNTER — Telehealth: Payer: Self-pay | Admitting: Family Medicine

## 2019-12-20 NOTE — Telephone Encounter (Signed)
Patient is dropping off leave of absence paper work for Dr. Vanessa Roosevelt to complete for her employer. LDOS 12/19/19.   I have placed form in blue team folder.  Patient would like for this form to be faxed to employer and she would also like someone to call her to let her know when form has been completed. The best call back is [phone].

## 2019-12-21 NOTE — Telephone Encounter (Signed)
Clinical info completed on Leave of Absence form.  Given to PCP in clinic today for completion.  Salvatore Marvel, CMA

## 2019-12-21 NOTE — Telephone Encounter (Signed)
Form completed and placed in nurse box upfront.

## 2019-12-21 NOTE — Telephone Encounter (Signed)
Form faxed to appropriate number and copy made for batch scanning.

## 2020-01-12 NOTE — L&D Delivery Note (Addendum)
LABOR COURSE Patient presented on 8/29 for SOL. On arrival, her cervical exam was 5/80/-2. She received adequate IV penicillin G for GBS prophylaxis. She progressed to complete at [facility] on 8/30. AROM at 0033 with thin meconium.   Delivery Note Called to room and patient was complete and pushing. Once baby was crowning and at +3, patient pushed through contractions for >10 minutes without further progress and late decelerations. Dr. Ilda Basset notified and called to delivery room to assist with vacuum-assisted delivery. He felt patient to be DOA, EFW 3300gm and pelvis felt adequate.  Verbal consent given to proceed. Foley removed and Kiwi vacuum applied to flexion point and with next contraction, suction to green zone achieved and head delivered OA on second vacuum-assisted push, no pop-offs. A single nuchal cord was present which was easily reducible. Shoulder and body delivered in usual fashion. At 0119 a viable female was delivered via Vaginal, Spontaneous vertex presentation.  Infant with spontaneous cry, placed on mother's abdomen, dried and stimulated. Cord clamped x 2 after 45-second delay, and cut by MD. Cord blood drawn. Placenta delivered spontaneously with gentle cord traction. Appears intact. Fundus firm with massage and Pitocin. Labia, perineum, vagina, and cervix inspected with second-degree perineal laceration that extended bilaterally into vaginal canal and R labial laceration which were repaired in the usual fashion. A R labial abrasion was also present and hemostatic. A rectal exam was performed during perineal repair which was normal with intact tissue.     APGAR: 7, 9; 3229gm Cord: 3VC with the following complications: None   Anesthesia:  Epidural Episiotomy: None Lacerations: second-degree perineal, R labial Suture Repair:  Perineal: 2-0 Vicryl Rapide and 3-0 Vicryl; R Labial: 4-0 Vicryl Est. Blood Loss (mL): 230  Mom to postpartum.  Baby to Couplet care / Skin to Skin.  Eppie Gibson, MD 09/09/20 2:04 AM    GME ATTESTATION:  I was gloved and present for the duration of the delivery. Performed vacuum assisted delivery of head with help of Dr. Ilda Basset, Dr. Joelyn Oms delivered infant shoulder and body. Performed laceration repair as noted above. I agree with the findings and the plan of care as documented in the resident's note.  Darrelyn Hillock, DO OB Fellow, [place] [facility] [place] 09/09/2020 9:11 AM   Agree with above. I was present for the entire procedure.   Durene Romans MD Attending Center for [employer])

## 2020-01-14 ENCOUNTER — Ambulatory Visit: Payer: 59 | Admitting: Family Medicine

## 2020-01-15 ENCOUNTER — Encounter: Payer: Self-pay | Admitting: Family Medicine

## 2020-01-15 ENCOUNTER — Other Ambulatory Visit: Payer: Self-pay

## 2020-01-15 ENCOUNTER — Other Ambulatory Visit (HOSPITAL_COMMUNITY)
Admission: RE | Admit: 2020-01-15 | Discharge: 2020-01-15 | Disposition: A | Discharge status: Home / Self Care | Payer: 59 | Source: Ambulatory Visit | Attending: Family Medicine | Admitting: Family Medicine

## 2020-01-15 ENCOUNTER — Ambulatory Visit (INDEPENDENT_AMBULATORY_CARE_PROVIDER_SITE_OTHER): Payer: 59 | Admitting: Family Medicine

## 2020-01-15 VITALS — BP 92/60 | HR 87 | Ht 62.0 in | Wt 110.0 lb

## 2020-01-15 DIAGNOSIS — N898 Other specified noninflammatory disorders of vagina: Secondary | ICD-10-CM | POA: Diagnosis not present

## 2020-01-15 DIAGNOSIS — O219 Vomiting of pregnancy, unspecified: Secondary | ICD-10-CM

## 2020-01-15 DIAGNOSIS — B9689 Other specified bacterial agents as the cause of diseases classified elsewhere: Secondary | ICD-10-CM

## 2020-01-15 DIAGNOSIS — N760 Acute vaginitis: Secondary | ICD-10-CM

## 2020-01-15 DIAGNOSIS — N912 Amenorrhea, unspecified: Secondary | ICD-10-CM | POA: Diagnosis not present

## 2020-01-15 DIAGNOSIS — Z3490 Encounter for supervision of normal pregnancy, unspecified, unspecified trimester: Secondary | ICD-10-CM

## 2020-01-15 DIAGNOSIS — F419 Anxiety disorder, unspecified: Secondary | ICD-10-CM

## 2020-01-15 LAB — POCT WET PREP (WET MOUNT)
Clue Cells Wet Prep Whiff POC: POSITIVE
Trichomonas Wet Prep HPF POC: ABSENT

## 2020-01-15 LAB — POCT URINE PREGNANCY: Preg Test, Ur: POSITIVE — AB

## 2020-01-15 MED ORDER — DOXYLAMINE-PYRIDOXINE 10-10 MG PO TBEC: DELAYED_RELEASE_TABLET | ORAL | 0 refills | Status: DC

## 2020-01-15 MED ORDER — METRONIDAZOLE 500 MG PO TABS
500.0000 mg | ORAL_TABLET | Freq: Two times a day (BID) | ORAL | 0 refills | Status: DC
Start: 2020-01-15 — End: 2020-02-13

## 2020-01-15 NOTE — Progress Notes (Unsigned)
   SUBJECTIVE:  CHIEF COMPLAINT / HPI:   1. Amenorrhea Patient reports LMP 12/01/19 with moderate confidence. She denies any bleeding since LMP. She does report short episodes of mild cramping in her lower pelvis consistent with mild menstrual cramps. She also reports nausea and vomiting that she has been taking zofran and compazine for. She is not currently taking prenatal vitamins  She also reports history of anxiety, currently well controlled, but required multiple medications in the past, which she cannot remember. She is not currently on any medications or being treated at this time.   2. Vaginal Odor  Patient reports vaginal odor and believes she has BV. No dysuria.   PERTINENT  PMH / PSH: Anxiety, Hx of STD  OBJECTIVE:  BP 92/60   Pulse 87   Ht 5\' 2"  (1.575 m)   Wt 110 lb (49.9 kg)   LMP 12/01/2019 (Approximate)   SpO2 98%   BMI 20.12 kg/m   General: Well appearing female, NAD. Accompanied by her partner today.  GU: External vulva and vagina nonerythematous, without any obvious lesions or rash.  No abnormal discharge appreciated.  Normal ruggae of vaginal walls.  Cervix is non erythematous and non-friable.  There is no cervical motion tenderness, masses or gross abnormalities appreciated during bimanual exam.   ASSESSMENT/PLAN:  Pregnancy OB labs ordered today. GC/C sent. Wet prep for vaginal odor.  Dating ultrasound ordered and scheduled.  List of OTC medications provided  Prenatal vitamins ordered  Diclegis sent to pharmacy for nausea/vomiting  Note provided at request of patient for work  Patient to schedule initial OB visit in 2-4 weeks.   Bacterial vaginosis Positive wet prep for BV today. Sent metronidazole to pharmacy.   Anxiety No current treatment. Continue to monitor at prenatal visits.    12/03/2019, MD [facility]   ADDENDUM:  Patient reports diclegis is not covered by pharmacy.Provided phenergan Rx.

## 2020-01-15 NOTE — Patient Instructions (Addendum)
We are obtaining intial labs today. Please schedule a follow up Initial OB visit before leaving the office today. You can schedule this in 3-4 weeks.  If you are having increased anxiety, please call us to schedule an appointment to be seen sooner if you would like to be seen sooner.  If your insurance does not cover the Diclegis, please call us to let us know. We can send in other medications.  Please see the list of medications okay in pregnancy attached to this packet.   Instructions for Diclegis  Initially, take two DICLEGIS delayed-release tablets orally at bedtime (Day 1). If this dose adequately controls symptoms the next day, continue taking two tablets daily at bedtime. However, if symptoms persist into the afternoon of Day 2, take the usual dose of two tablets at bedtime that night then take three tablets starting on Day 3 (one tablet in the morning and two tablets at bedtime). If these three tablets adequately control symptoms on Day 4, continue taking three tablets daily. Otherwise take four tablets starting on Day 4 (one tablet in the morning, one tablet mid-afternoon and two tablets at bedtime). The maximum recommended dose is four tablets (one in the morning, one in the mid-afternoon and two at bedtime) daily. Take on an empty stomach with a glass of water. Swallow tablets whole. Do not crush, chew, or split DICLEGIS tablets. Take as a daily prescription and not on an as needed basis.   First Trimester of Pregnancy The first trimester of pregnancy is from week 1 until the end of week 13 (months 1 through 3). A week after a sperm fertilizes an egg, the egg will implant on the wall of the uterus. This embryo will begin to develop into a baby. Genes from you and your partner will form the baby. The female genes will determine whether the baby will be a boy or a girl. At 6-8 weeks, the eyes and face will be formed, and the heartbeat can be seen on ultrasound. At the end of 12 weeks, all the  baby's organs will be formed. Now that you are pregnant, you will want to do everything you can to have a healthy baby. Two of the most important things are to get good prenatal care and to follow your health care provider's instructions. Prenatal care is all the medical care you receive before the baby's birth. This care will help prevent, find, and treat any problems during the pregnancy and childbirth. Body changes during your first trimester Your body goes through many changes during pregnancy. The changes vary from woman to woman.  You may gain or lose a couple of pounds at first.  You may feel sick to your stomach (nauseous) and you may throw up (vomit). If the vomiting is uncontrollable, call your health care provider.  You may tire easily.  You may develop headaches that can be relieved by medicines. All medicines should be approved by your health care provider.  You may urinate more often. Painful urination may mean you have a bladder infection.  You may develop heartburn as a result of your pregnancy.  You may develop constipation because certain hormones are causing the muscles that push stool through your intestines to slow down.  You may develop hemorrhoids or swollen veins (varicose veins).  Your breasts may begin to grow larger and become tender. Your nipples may stick out more, and the tissue that surrounds them (areola) may become darker.  Your gums may bleed and may be  sensitive to brushing and flossing.  Dark spots or blotches (chloasma, mask of pregnancy) may develop on your face. This will likely fade after the baby is born.  Your menstrual periods will stop.  You may have a loss of appetite.  You may develop cravings for certain kinds of food.  You may have changes in your emotions from day to day, such as being excited to be pregnant or being concerned that something may go wrong with the pregnancy and baby.  You may have more vivid and strange dreams.  You  may have changes in your hair. These can include thickening of your hair, rapid growth, and changes in texture. Some women also have hair loss during or after pregnancy, or hair that feels dry or thin. Your hair will most likely return to normal after your baby is born. What to expect at prenatal visits During a routine prenatal visit:  You will be weighed to make sure you and the baby are growing normally.  Your blood pressure will be taken.  Your abdomen will be measured to track your baby's growth.  The fetal heartbeat will be listened to between weeks 10 and 14 of your pregnancy.  Test results from any previous visits will be discussed. Your health care provider may ask you:  How you are feeling.  If you are feeling the baby move.  If you have had any abnormal symptoms, such as leaking fluid, bleeding, severe headaches, or abdominal cramping.  If you are using any tobacco products, including cigarettes, chewing tobacco, and electronic cigarettes.  If you have any questions. Other tests that may be performed during your first trimester include:  Blood tests to find your blood type and to check for the presence of any previous infections. The tests will also be used to check for low iron levels (anemia) and protein on red blood cells (Rh antibodies). Depending on your risk factors, or if you previously had diabetes during pregnancy, you may have tests to check for high blood sugar that affects pregnant women (gestational diabetes).  Urine tests to check for infections, diabetes, or protein in the urine.  An ultrasound to confirm the proper growth and development of the baby.  Fetal screens for spinal cord problems (spina bifida) and Down syndrome.  HIV (human immunodeficiency virus) testing. Routine prenatal testing includes screening for HIV, unless you choose not to have this test.  You may need other tests to make sure you and the baby are doing well. Follow these  instructions at home: Medicines  Follow your health care provider's instructions regarding medicine use. Specific medicines may be either safe or unsafe to take during pregnancy.  Take a prenatal vitamin that contains at least 600 micrograms (mcg) of folic acid.  If you develop constipation, try taking a stool softener if your health care provider approves. Eating and drinking   Eat a balanced diet that includes fresh fruits and vegetables, whole grains, good sources of protein such as meat, eggs, or tofu, and low-fat dairy. Your health care provider will help you determine the amount of weight gain that is right for you.  Avoid raw meat and uncooked cheese. These carry germs that can cause birth defects in the baby.  Eating four or five small meals rather than three large meals a day may help relieve nausea and vomiting. If you start to feel nauseous, eating a few soda crackers can be helpful. Drinking liquids between meals, instead of during meals, also seems to help  ease nausea and vomiting.  Limit foods that are high in fat and processed sugars, such as fried and sweet foods.  To prevent constipation: ? Eat foods that are high in fiber, such as fresh fruits and vegetables, whole grains, and beans. ? Drink enough fluid to keep your urine clear or pale yellow. Activity  Exercise only as directed by your health care provider. Most women can continue their usual exercise routine during pregnancy. Try to exercise for 30 minutes at least 5 days a week. Exercising will help you: ? Control your weight. ? Stay in shape. ? Be prepared for labor and delivery.  Experiencing pain or cramping in the lower abdomen or lower back is a good sign that you should stop exercising. Check with your health care provider before continuing with normal exercises.  Try to avoid standing for long periods of time. Move your legs often if you must stand in one place for a long time.  Avoid heavy  lifting.  Wear low-heeled shoes and practice good posture.  You may continue to have sex unless your health care provider tells you not to. Relieving pain and discomfort  Wear a good support bra to relieve breast tenderness.  Take warm sitz baths to soothe any pain or discomfort caused by hemorrhoids. Use hemorrhoid cream if your health care provider approves.  Rest with your legs elevated if you have leg cramps or low back pain.  If you develop varicose veins in your legs, wear support hose. Elevate your feet for 15 minutes, 3-4 times a day. Limit salt in your diet. Prenatal care  Schedule your prenatal visits by the twelfth week of pregnancy. They are usually scheduled monthly at first, then more often in the last 2 months before delivery.  Write down your questions. Take them to your prenatal visits.  Keep all your prenatal visits as told by your health care provider. This is important. Safety  Wear your seat belt at all times when driving.  Make a list of emergency phone numbers, including numbers for family, friends, the hospital, and police and fire departments. General instructions  Ask your health care provider for a referral to a local prenatal education class. Begin classes no later than the beginning of month 6 of your pregnancy.  Ask for help if you have counseling or nutritional needs during pregnancy. Your health care provider can offer advice or refer you to specialists for help with various needs.  Do not use hot tubs, steam rooms, or saunas.  Do not douche or use tampons or scented sanitary pads.  Do not cross your legs for long periods of time.  Avoid cat litter boxes and soil used by cats. These carry germs that can cause birth defects in the baby and possibly loss of the fetus by miscarriage or stillbirth.  Avoid all smoking, herbs, alcohol, and medicines not prescribed by your health care provider. Chemicals in these products affect the formation and  growth of the baby.  Do not use any products that contain nicotine or tobacco, such as cigarettes and e-cigarettes. If you need help quitting, ask your health care provider. You may receive counseling support and other resources to help you quit.  Schedule a dentist appointment. At home, brush your teeth with a soft toothbrush and be gentle when you floss. Contact a health care provider if:  You have dizziness.  You have mild pelvic cramps, pelvic pressure, or nagging pain in the abdominal area.  You have persistent nausea, vomiting,  or diarrhea.  You have a bad smelling vaginal discharge.  You have pain when you urinate.  You notice increased swelling in your face, hands, legs, or ankles.  You are exposed to fifth disease or chickenpox.  You are exposed to [place] measles (rubella) and have never had it. Get help right away if:  You have a fever.  You are leaking fluid from your vagina.  You have spotting or bleeding from your vagina.  You have severe abdominal cramping or pain.  You have rapid weight gain or loss.  You vomit blood or material that looks like coffee grounds.  You develop a severe headache.  You have shortness of breath.  You have any kind of trauma, such as from a fall or a car accident. Summary  The first trimester of pregnancy is from week 1 until the end of week 13 (months 1 through 3).  Your body goes through many changes during pregnancy. The changes vary from woman to woman.  You will have routine prenatal visits. During those visits, your health care provider will examine you, discuss any test results you may have, and talk with you about how you are feeling. This information is not intended to replace advice given to you by your health care provider. Make sure you discuss any questions you have with your health care provider. Document Revised: 12/10/2016 Document Reviewed: 12/10/2015 Elsevier Patient Education  2020 [employer]   How a  Baby Grows During Pregnancy  Pregnancy begins when a female's sperm enters a female's egg (fertilization). Fertilization usually happens in one of the tubes (fallopian tubes) that connect the ovaries to the womb (uterus). The fertilized egg moves down the fallopian tube to the uterus. Once it reaches the uterus, it implants into the lining of the uterus and begins to grow. For the first 10 weeks, the fertilized egg is called an embryo. After 10 weeks, it is called a fetus. As the fetus continues to grow, it receives oxygen and nutrients through tissue (placenta) that grows to support the developing baby. The placenta is the life support system for the baby. It provides oxygen and nutrition and removes waste. Learning as much as you can about your pregnancy and how your baby is developing can help you enjoy the experience. It can also make you aware of when there might be a problem and when to ask questions. How long does a typical pregnancy last? A pregnancy usually lasts 280 days, or about 40 weeks. Pregnancy is divided into three periods of growth, also called trimesters:  First trimester: 0-12 weeks.  Second trimester: 13-27 weeks.  Third trimester: 28-40 weeks. The day when your baby is ready to be born (full term) is your estimated date of delivery. How does my baby develop month by month? First month  The fertilized egg attaches to the inside of the uterus.  Some cells will form the placenta. Others will form the fetus.  The arms, legs, brain, spinal cord, lungs, and heart begin to develop.  At the end of the first month, the heart begins to beat. Second month  The bones, inner ear, eyelids, hands, and feet form.  The genitals develop.  By the end of 8 weeks, all major organs are developing. Third month  All of the internal organs are forming.  Teeth develop below the gums.  Bones and muscles begin to grow. The spine can flex.  The skin is transparent.  Fingernails and  toenails begin to form.  Arms  and legs continue to grow longer, and hands and feet develop.  The fetus is about 3 inches (7.6 cm) long. Fourth month  The placenta is completely formed.  The external sex organs, neck, outer ear, eyebrows, eyelids, and fingernails are formed.  The fetus can hear, swallow, and move its arms and legs.  The kidneys begin to produce urine.  The skin is covered with a white, waxy coating (vernix) and very fine hair (lanugo). Fifth month  The fetus moves around more and can be felt for the first time (quickening).  The fetus starts to sleep and wake up and may begin to suck its finger.  The nails grow to the end of the fingers.  The organ in the digestive system that makes bile (gallbladder) functions and helps to digest nutrients.  If your baby is a girl, eggs are present in her ovaries. If your baby is a boy, testicles start to move down into his scrotum. Sixth month  The lungs are formed.  The eyes open. The brain continues to develop.  Your baby has fingerprints and toe prints. Your baby's hair grows thicker.  At the end of the second trimester, the fetus is about 9 inches (22.9 cm) long. Seventh month  The fetus kicks and stretches.  The eyes are developed enough to sense changes in light.  The hands can make a grasping motion.  The fetus responds to sound. Eighth month  All organs and body systems are fully developed and functioning.  Bones harden, and taste buds develop. The fetus may hiccup.  Certain areas of the brain are still developing. The skull remains soft. Ninth month  The fetus gains about  lb (0.23 kg) each week.  The lungs are fully developed.  Patterns of sleep develop.  The fetus's head typically moves into a head-down position (vertex) in the uterus to prepare for birth.  The fetus weighs 6-9 lb (2.72-4.08 kg) and is 19-20 inches (48.26-50.8 cm) long. What can I do to have a healthy pregnancy and help my  baby develop? General instructions  Take prenatal vitamins as directed by your health care provider. These include vitamins such as folic acid, iron, calcium, and vitamin D. They are important for healthy development.  Take medicines only as directed by your health care provider. Read labels and ask a pharmacist or your health care provider whether over-the-counter medicines, supplements, and prescription drugs are safe to take during pregnancy.  Keep all follow-up visits as directed by your health care provider. This is important. Follow-up visits include prenatal care and screening tests. How do I know if my baby is developing well? At each prenatal visit, your health care provider will do several different tests to check on your health and keep track of your baby's development. These include:  Fundal height and position. ? Your health care provider will measure your growing belly from your pubic bone to the top of the uterus using a tape measure. ? Your health care provider will also feel your belly to determine your baby's position.  Heartbeat. ? An ultrasound in the first trimester can confirm pregnancy and show a heartbeat, depending on how far along you are. ? Your health care provider will check your baby's heart rate at every prenatal visit.  Second trimester ultrasound. ? This ultrasound checks your baby's development. It also may show your baby's gender. What should I do if I have concerns about my baby's development? Always talk with your health care provider about any  concerns that you may have about your pregnancy and your baby. Summary  A pregnancy usually lasts 280 days, or about 40 weeks. Pregnancy is divided into three periods of growth, also called trimesters.  Your health care provider will monitor your baby's growth and development throughout your pregnancy.  Follow your health care provider's recommendations about taking prenatal vitamins and medicines during your  pregnancy.  Talk with your health care provider if you have any concerns about your pregnancy or your developing baby. This information is not intended to replace advice given to you by your health care provider. Make sure you discuss any questions you have with your health care provider. Document Revised: 04/20/2018 Document Reviewed: 11/10/2016 Elsevier Patient Education  [place].   Morning Sickness  Morning sickness is when you feel sick to your stomach (nauseous) during pregnancy. You may feel sick to your stomach and throw up (vomit). You may feel sick in the morning, but you can feel this way at any time of day. Some women feel very sick to their stomach and cannot stop throwing up (hyperemesis gravidarum). Follow these instructions at home: Medicines  Take over-the-counter and prescription medicines only as told by your doctor. Do not take any medicines until you talk with your doctor about them first.  Taking multivitamins before getting pregnant can stop or lessen the harshness of morning sickness. Eating and drinking  Eat dry toast or crackers before getting out of bed.  Eat 5 or 6 small meals a day.  Eat dry and bland foods like rice and baked potatoes.  Do not eat greasy, fatty, or spicy foods.  Have someone cook for you if the smell of food causes you to feel sick or throw up.  If you feel sick to your stomach after taking prenatal vitamins, take them at night or with a snack.  Eat protein when you need a snack. Nuts, yogurt, and cheese are good choices.  Drink fluids throughout the day.  Try ginger ale made with real ginger, ginger tea made from fresh grated ginger, or ginger candies. General instructions  Do not use any products that have nicotine or tobacco in them, such as cigarettes and e-cigarettes. If you need help quitting, ask your doctor.  Use an air purifier to keep the air in your house free of smells.  Get lots of fresh air.  Try to  avoid smells that make you feel sick.  Try: ? Wearing a bracelet that is used for seasickness (acupressure wristband). ? Going to a doctor who puts thin needles into certain body points (acupuncture) to improve how you feel. Contact a doctor if:  You need medicine to feel better.  You feel dizzy or light-headed.  You are losing weight. Get help right away if:  You feel very sick to your stomach and cannot stop throwing up.  You pass out (faint).  You have very bad pain in your belly. Summary  Morning sickness is when you feel sick to your stomach (nauseous) during pregnancy.  You may feel sick in the morning, but you can feel this way at any time of day.  Making some changes to what you eat may help your symptoms go away. This information is not intended to replace advice given to you by your health care provider. Make sure you discuss any questions you have with your health care provider. Document Revised: 12/10/2016 Document Reviewed: 01/29/2016 Elsevier Patient Education  2020 [employer].

## 2020-01-16 ENCOUNTER — Other Ambulatory Visit: Payer: Self-pay | Admitting: Family Medicine

## 2020-01-16 ENCOUNTER — Encounter: Payer: Self-pay | Admitting: Family Medicine

## 2020-01-16 DIAGNOSIS — Z3402 Encounter for supervision of normal first pregnancy, second trimester: Secondary | ICD-10-CM | POA: Insufficient documentation

## 2020-01-16 DIAGNOSIS — Z3493 Encounter for supervision of normal pregnancy, unspecified, third trimester: Secondary | ICD-10-CM | POA: Insufficient documentation

## 2020-01-16 DIAGNOSIS — Z3490 Encounter for supervision of normal pregnancy, unspecified, unspecified trimester: Secondary | ICD-10-CM

## 2020-01-16 LAB — CERVICOVAGINAL ANCILLARY ONLY
Chlamydia: NEGATIVE
Comment: NEGATIVE
Comment: NORMAL
Neisseria Gonorrhea: NEGATIVE

## 2020-01-16 MED ORDER — PRENATAL GUMMIES/DHA & FA 0.4-32.5 MG PO CHEW: 1.0000 | CHEWABLE_TABLET | Freq: Every day | ORAL | 3 refills | Status: DC

## 2020-01-16 MED ORDER — PROMETHAZINE HCL 12.5 MG PO TABS: 12.5000 mg | ORAL_TABLET | Freq: Three times a day (TID) | ORAL | 0 refills | Status: DC | PRN

## 2020-01-16 NOTE — Assessment & Plan Note (Signed)
No current treatment. Continue to monitor at prenatal visits.

## 2020-01-16 NOTE — Assessment & Plan Note (Signed)
Positive wet prep for BV today. Sent metronidazole to pharmacy.

## 2020-01-16 NOTE — Assessment & Plan Note (Addendum)
OB labs ordered today. GC/C sent. Wet prep for vaginal odor.  Dating ultrasound ordered and scheduled.  List of OTC medications provided  Prenatal vitamins ordered  Diclegis sent to pharmacy for nausea/vomiting  Note provided at request of patient for work  Patient to schedule initial OB visit in 2-4 weeks.

## 2020-01-17 LAB — OBSTETRIC PANEL, INCLUDING HIV
Antibody Screen: NEGATIVE
Basophils Absolute: 0 10*3/uL (ref 0.0–0.2)
Basos: 0 %
EOS (ABSOLUTE): 0.1 10*3/uL (ref 0.0–0.4)
Eos: 1 %
HIV Screen 4th Generation wRfx: NONREACTIVE
Hematocrit: 38.3 % (ref 34.0–46.6)
Hemoglobin: 13.1 g/dL (ref 11.1–15.9)
Hepatitis B Surface Ag: NEGATIVE
Immature Grans (Abs): 0 10*3/uL (ref 0.0–0.1)
Immature Granulocytes: 0 %
Lymphocytes Absolute: 3 10*3/uL (ref 0.7–3.1)
Lymphs: 36 %
MCH: 30.8 pg (ref 26.6–33.0)
MCHC: 34.2 g/dL (ref 31.5–35.7)
MCV: 90 fL (ref 79–97)
Monocytes Absolute: 0.5 10*3/uL (ref 0.1–0.9)
Monocytes: 6 %
Neutrophils Absolute: 4.7 10*3/uL (ref 1.4–7.0)
Neutrophils: 57 %
Platelets: 266 10*3/uL (ref 150–450)
RBC: 4.26 x10E6/uL (ref 3.77–5.28)
RDW: 12.3 % (ref 11.7–15.4)
RPR Ser Ql: NONREACTIVE
Rh Factor: NEGATIVE
Rubella Antibodies, IGG: 2.04 index (ref >0.99)
WBC: 8.3 10*3/uL (ref 3.4–10.8)

## 2020-01-17 LAB — URINE CULTURE, OB REFLEX

## 2020-01-17 LAB — HGB FRACTIONATION CASCADE
Hgb A2: 2.7 % (ref 1.8–3.2)
Hgb A: 97.3 % (ref 96.4–98.8)
Hgb F: 0 % (ref 0.0–2.0)
Hgb S: 0 %

## 2020-01-17 LAB — CULTURE, OB URINE

## 2020-01-17 LAB — HCV INTERPRETATION

## 2020-01-17 LAB — HCV AB W REFLEX TO QUANT PCR: HCV Ab: <0.1 s/co ratio (ref 0.0–0.9)

## 2020-01-22 ENCOUNTER — Other Ambulatory Visit: Payer: Self-pay

## 2020-01-22 ENCOUNTER — Other Ambulatory Visit: Payer: Self-pay | Admitting: Family Medicine

## 2020-01-22 ENCOUNTER — Ambulatory Visit
Admission: RE | Admit: 2020-01-22 | Discharge: 2020-01-22 | Disposition: A | Discharge status: Home / Self Care | Payer: 59 | Source: Ambulatory Visit | Attending: Family Medicine | Admitting: Family Medicine

## 2020-01-22 DIAGNOSIS — Z3490 Encounter for supervision of normal pregnancy, unspecified, unspecified trimester: Secondary | ICD-10-CM

## 2020-01-23 LAB — CYTOLOGY - PAP
Comment: NEGATIVE
Diagnosis: UNDETERMINED — AB
High risk HPV: POSITIVE — AB

## 2020-01-24 ENCOUNTER — Encounter: Payer: Self-pay | Admitting: Family Medicine

## 2020-01-24 NOTE — Progress Notes (Signed)
Spoke to patient about results.  - updated OB charting with due date based on ultrasound.  - repeat pap smear in 1 year, + HPV and ASCUS, HM modifier changed   Patient reports continued nausea and vomiting that has not resolved with phenergan. It is causing her to miss work. She is requesting a note to excuse her from work in the mornings until symptoms improve. She works at [employer].  - will write note, patient to pick up at front desk  - discussed other treatments for nausea & vomiting. Vitamin B6 q 10-25 mg once daily. Can take up to 3 times a day (q8hours). If no improvement with three times daily dosing, can then add on Unisom 25mg  once before bed nightly.  - return precautions

## 2020-02-13 ENCOUNTER — Ambulatory Visit (INDEPENDENT_AMBULATORY_CARE_PROVIDER_SITE_OTHER): Payer: 59 | Admitting: Family Medicine

## 2020-02-13 ENCOUNTER — Other Ambulatory Visit: Payer: Self-pay

## 2020-02-13 VITALS — BP 108/56 | HR 85 | Wt 113.4 lb

## 2020-02-13 DIAGNOSIS — Z369 Encounter for antenatal screening, unspecified: Secondary | ICD-10-CM

## 2020-02-13 DIAGNOSIS — Z3A09 9 weeks gestation of pregnancy: Secondary | ICD-10-CM

## 2020-02-13 NOTE — Progress Notes (Signed)
Patient Name: Marissa Wright Date of Birth: 01/09/1996 [place] Initial Prenatal Visit  Marissa Wright is a 25 y.o. year old G1P0 at [redacted]w[redacted]d who presents for her initial prenatal visit.  Denies abdominal pain, vaginal bleeding but has clear discharge. Recently treated for BV.  Pregnancy is planned, stopped taking birth control Aug/Sep 21. She reports breast tenderness and morning sickness. Usually vomits throughout the day. Promethazine helps a little. Able to tolerate PO.  She is taking a prenatal vitamin.  She denies pelvic pain or vaginal bleeding.   Pregnancy Dating: . The patient is dated by [place] . LMP:  12/01/19 . Period is certain:  Yes.  . Periods were regular:  Yes.  Marland Kitchen LMP was a typical period:  Yes.  . Using hormonal contraception in 3 months prior to conception: Yes  Lab Review: . Blood type: A . Rh Status: - . Antibody screen: Negative . HIV: Negative . RPR: Negative . Hemoglobin electrophoresis reviewed: Yes . Results of OB urine culture are: Negative . Rubella: Immune . Varicella status is immune  PMH: Reviewed and as detailed below: . HTN: Yes  . Type 1 or 2 Diabetes: No  . Depression: Yes, worse when she was age 63. Also has anxiety which has worsening through pregnancy. Used to take mood stabilizers and anxiety meds before.  . Seizure disorder:  No . VTE: No ,  . History of STI No,  . Abnormal Pap smear:  Yes, ASCUS + HPV, needs repeat Pap in 1 year from now . Genital herpes simplex:  No   PSH: . Gynecologic Surgery:  no . Surgical history reviewed, notable for: removal of bone in right ankle  Obstetric History: . Obstetric history tab updated and reviewed.  . Summary of prior pregnancies: 0 . Cesarean delivery: No  . Gestational Diabetes:  No . Hypertension in pregnancy: No . History of preterm birth: No . History of LGA/SGA infant:  No . History of shoulder dystocia: No . Indications for referral were reviewed, and the patient  has no obstetric indications for referral to [facility] at this time.   Social History: . Partner's name: not in the picture right now. Supportive friends and family. . Tobacco use: No Smokes marijuana  . Alcohol use:  No . Other substance use:  No  Current Medications:  . Tylenol, promethazine  . Reviewed and appropriate in pregnancy.   Genetic and Infection Screen: . Flow Sheet Updated Yes  Prenatal Exam: Gen: Well nourished, well developed.  No distress.  Vitals noted. HEENT: Normocephalic, atraumatic.  Neck supple without cervical lymphadenopathy, thyromegaly or thyroid nodules.  Fair dentition. CV: RRR no murmur, gallops or rubs Lungs: CTA B.  Normal respiratory effort without wheezes or rales. Abd: soft, NTND. +BS.  Uterus not appreciated above pelvis. GU: Normal external female genitalia without lesions.  Nl vaginal, well rugated without lesions. No vaginal discharge.  Bimanual exam: No adnexal mass or TTP. No CMT.  Uterus size-/a Ext: No clubbing, cyanosis or edema. Psych: Normal grooming and dress.  Not depressed or anxious appearing.  Normal thought content and process without flight of ideas or looseness of associations  Fetal heart tones: not greater than 10 weeks so did not obtain *  Assessment/Plan:  Marissa Wright is a 25 y.o. G1P0 at [redacted]w[redacted]d who presents to initiate prenatal care. She is doing well.  Current pregnancy issues include none  1. Routine prenatal care: Marland Kitchen As dating is reliable, a dating ultrasound has not  been ordered. Ordered had [place] which showed 6wk1d on 01/22/20. Using [place] for dating as pt was on birth control 3 months prior to conceiving. Dating tab updated. . Pre-pregnancy weight updated. Expected weight gain this pregnancy is 25-35 lb . Prenatal labs reviewed, normal . Indications for referral to [facility] were reviewed and the patient does not meet criteria for referral.  . Medication list reviewed and updated.  . Recommended patient see a dentist  for regular care.  . Bleeding and pain precautions reviewed. . Importance of prenatal vitamins reviewed.  . Genetic screening offered. Patient opted for: first trimester screening including nuchal translucency. . The patient will not age 30 or over at time of delivery. Referral to genetic counseling was not offered today.  . The patient has the following risk factors for preexisting diabetes:none. An early 1 hour glucose tolerance test was not ordered. . Pregnancy Medical Home and PHQ-9 forms completed, problems noted: no  Provided counseling on cessation of smoking marijuana.   PHQ-5. Pt usually has anxiety and low mood at baseline. Declined anti-depressant and therapy. She would like to manage it herself during the pregnancy.  Offered covid and flu vaccine however pt declined. Explained importance of these vaccines especially during pregnancy to reduce severity of sx.  Pt would like to know the gender of the baby through blood test. Discussed with Dr Ardelia Mems who recommended clinic may be offering this service when she returns for second visit.   Follow up 4 weeks for next prenatal visit.

## 2020-02-13 NOTE — Patient Instructions (Signed)
Thank you for coming to see me today. It was a pleasure. Today we discussed your nausea. It should hopefully go away or get better by the 2nd trimester. You can take the promethazine, vitamin b6 etc.  We will get some labs today.  If they are abnormal or we need to do something about them, I will call you.  If they are normal, I will send you a message on MyChart (if it is active) or a letter in the mail.  If you don't hear from [place] in 2 weeks, please call the office at the number below.   Please follow-up with in 1 month for the next Ob visit  If you have any questions or concerns, please do not hesitate to call the office at [phone].  The nuchal translucency scan (neck thickness of baby) will be done at [facility] 11-14 weeks. Please call [phone] to follow up on this appointment.  Please go to the MAU if you have heavy vaginal bleeding, period like cramps, fevers, fluid leakage from vagina etc.  Best wishes,   Dr Posey Pronto

## 2020-02-14 ENCOUNTER — Other Ambulatory Visit: Payer: Self-pay | Admitting: Family Medicine

## 2020-02-18 ENCOUNTER — Telehealth: Payer: Self-pay | Admitting: *Deleted

## 2020-02-18 NOTE — Telephone Encounter (Signed)
-----   Message from Lattie Haw, MD sent at 02/14/2020 12:20 PM EST ----- Regarding: ob visit Hi team,   Please can you schedule this pt a follow up OB visit in 1 month and let her know.  Thank you,  [place]

## 2020-02-19 NOTE — Telephone Encounter (Signed)
Pt has an OB appt with Dr. Sandi Carne on 3/8. Ottis Stain, CMA

## 2020-02-20 NOTE — Telephone Encounter (Signed)
CORRECTION: Pt has an appt on 3/9 not 3/8. Ottis Stain, CMA

## 2020-03-12 ENCOUNTER — Other Ambulatory Visit: Payer: Self-pay

## 2020-03-12 ENCOUNTER — Ambulatory Visit: Payer: 59 | Admitting: *Deleted

## 2020-03-12 ENCOUNTER — Other Ambulatory Visit: Payer: Self-pay | Admitting: Maternal & Fetal Medicine

## 2020-03-12 ENCOUNTER — Ambulatory Visit: Payer: 59

## 2020-03-12 ENCOUNTER — Ambulatory Visit: Payer: 59 | Attending: Obstetrics and Gynecology

## 2020-03-12 VITALS — BP 111/53 | HR 95 | Wt 118.8 lb

## 2020-03-12 DIAGNOSIS — O322XX0 Maternal care for transverse and oblique lie, not applicable or unspecified: Secondary | ICD-10-CM

## 2020-03-12 DIAGNOSIS — Z3689 Encounter for other specified antenatal screening: Secondary | ICD-10-CM

## 2020-03-12 DIAGNOSIS — Z369 Encounter for antenatal screening, unspecified: Secondary | ICD-10-CM | POA: Diagnosis present

## 2020-03-12 DIAGNOSIS — Z3A13 13 weeks gestation of pregnancy: Secondary | ICD-10-CM | POA: Diagnosis not present

## 2020-03-12 DIAGNOSIS — Z3682 Encounter for antenatal screening for nuchal translucency: Secondary | ICD-10-CM

## 2020-03-14 ENCOUNTER — Telehealth: Payer: Self-pay | Admitting: Genetic Counselor

## 2020-03-14 LAB — FIRST TRIMESTER SCREEN W/NT
CRL: 81.5 mm
DIA MoM: 1.93
DIA Value: 439.3 pg/mL
Gest Age-Collect: 13.7 weeks
Maternal Age At EDD: 22.7 yr
Nuchal Translucency MoM: 0.99
Nuchal Translucency: 1.8 mm
Number of Fetuses: 1
PAPP-A MoM: 0.97
PAPP-A Value: 2023.5 ng/mL
Test Results:: NEGATIVE
Weight: 118 [lb_av]
hCG MoM: 0.47
hCG Value: 42.3 IU/mL

## 2020-03-14 NOTE — Telephone Encounter (Signed)
I called Ms. Marissa Wright to discuss her negative first trimester screen results. We reviewed that the risk for her pregnancy to be affected by Down syndrome decreased from her 1 in 1006 age-related risk to 1 in 5600, and the risk for trisomy 18 decreased from her 1 in 1970 age-related risk to less than 1 in 10,000 based on the results of this screen. Ms. Marissa Wright was reminded that while this screen reduces the likelihood of the pregnancy being affected by trisomy 54 or trisomy 80, it cannot be considered diagnostic. Diagnostic testing via CVS or amniocentesis is available should she be interested in pursuing this. Additionally, first trimester Wright does not screen for open neural tube defects such as spina bifida. It is recommended that Marissa Wright be ordered around 16-18 weeks' gestation to screen for this. Ms. Marissa Wright confirmed that she had no questions about these results.  Marissa Manis, MS, [facility] Genetic Counselor

## 2020-03-19 ENCOUNTER — Ambulatory Visit (INDEPENDENT_AMBULATORY_CARE_PROVIDER_SITE_OTHER): Payer: 59 | Admitting: Family Medicine

## 2020-03-19 ENCOUNTER — Other Ambulatory Visit: Payer: Self-pay

## 2020-03-19 DIAGNOSIS — Z3402 Encounter for supervision of normal first pregnancy, second trimester: Secondary | ICD-10-CM

## 2020-03-19 NOTE — Patient Instructions (Signed)

## 2020-03-19 NOTE — Progress Notes (Signed)
  Patient Name: Marissa Wright Date of Birth: 1995/03/05 [place] Prenatal Visit  Marissa Wright is a 25 y.o. G1P0 at [redacted]w[redacted]d here for routine follow up. She is dated by early ultrasound.  She reports nausea, but states that it is improving.  She denies vaginal bleeding.  See flow sheet for details.  Vitals:   03/19/20 1333  BP: (!) 102/56  Pulse: 93   Flowsheet Row Routine Prenatal from 03/19/2020 in [place]  PHQ-9 Total Score 4      A/P: Pregnancy at [redacted]w[redacted]d.  Doing well.    1. Routine Prenatal Care:  Marland Kitchen Dating reviewed, dating tab is correct . Fetal heart tones Appropriate . Influenza vaccine not administered as patient declined, will continue to discuss.   Marland Kitchen COVID vaccination was discussed and declines.  Continue to discuss. . The patient has the following indication for screening preexisting diabetes: Reviewed indications for early 1 hour glucose testing, not indicated . Marland Kitchen Anatomy ultrasound already scheduled for 04/23/2020. Marland Kitchen Patient is interested in genetic screening to determine gender. She would like cell free DNA.  As she is past 13 weeks and 6 days, a Quad screen  was offered, but declined.  Had low risk first trimester screen.  . Pregnancy education including expected weight gain in pregnancy, OTC medication use, continued use of prenatal vitamin, smoking cessation if applicable, and nutrition in pregnancy.   . Bleeding and pain precautions reviewed.  2. Pregnancy issues include the following and were addressed as appropriate today:  . Nausea: improving.  Discussed carb rich, light snacking. . She is no longer smoking THC. Marland Kitchen Discussed cell-free DNA testing.  Dr. Ardelia Mems came in room to also discuss with patient.  Unsure if insurance will cover, so she was given contact information for [employer] representative and will discuss with her directly.  If she would like to proceed, will call to schedule lab appointment. . Problem list  and  pregnancy box updated: Yes.   Follow up 4 weeks.  Scheduled with Dr. Posey Pronto.

## 2020-03-25 ENCOUNTER — Other Ambulatory Visit: Payer: Self-pay | Admitting: Family Medicine

## 2020-04-17 ENCOUNTER — Ambulatory Visit (INDEPENDENT_AMBULATORY_CARE_PROVIDER_SITE_OTHER): Payer: 59 | Admitting: Family Medicine

## 2020-04-17 ENCOUNTER — Other Ambulatory Visit: Payer: Self-pay

## 2020-04-17 DIAGNOSIS — Z3402 Encounter for supervision of normal first pregnancy, second trimester: Secondary | ICD-10-CM

## 2020-04-17 NOTE — Patient Instructions (Signed)
Thank you for coming to see me today. It was a pleasure.  The baby is growing well and had a great heartbeat today.  You have your anatomy ultrasound in a couple of days. Today we discussed your headaches.  They are likely tension headaches which you are getting prior to pregnancy I recommend Tylenol, ice and heat.  If the headache change in nature, become more severe, vomiting, dizziness, vision changes etc. then please go to the MAU or ED immediately.  For the sciatica I would recommend gentle stretching of the legs and avoid prolonged periods of sitting down.  Please follow-up in the next OB clinic in 4 weeks time.  If you have any questions or concerns, please do not hesitate to call the office at [phone].  Best wishes,    Dr Posey Pronto    Sciatica  Sciatica is pain, weakness, tingling, or loss of feeling (numbness) along the sciatic nerve. The sciatic nerve starts in the lower back and goes down the back of each leg. Sciatica usually goes away on its own or with treatment. Sometimes, sciatica may come back (recur). What are the causes? This condition happens when the sciatic nerve is pinched or has pressure put on it. This may be the result of:  A disk in between the bones of the spine bulging out too far (herniated disk).  Changes in the spinal disks that occur with aging.  A condition that affects a muscle in the butt.  Extra bone growth near the sciatic nerve.  A break (fracture) of the area between your hip bones (pelvis).  Pregnancy.  Tumor. This is rare. What increases the risk? You are more likely to develop this condition if you:  Play sports that put pressure or stress on the spine.  Have poor strength and ease of movement (flexibility).  Have had a back injury in the past.  Have had back surgery.  Sit for long periods of time.  Do activities that involve bending or lifting over and over again.  Are very overweight (obese). What are the signs or  symptoms? Symptoms can vary from mild to very bad. They may include:  Any of these problems in the lower back, leg, hip, or butt: ? Mild tingling, loss of feeling, or dull aches. ? Burning sensations. ? Sharp pains.  Loss of feeling in the back of the calf or the sole of the foot.  Leg weakness.  Very bad back pain that makes it hard to move. These symptoms may get worse when you cough, sneeze, or laugh. They may also get worse when you sit or stand for long periods of time. How is this treated? This condition often gets better without any treatment. However, treatment may include:  Changing or cutting back on physical activity when you have pain.  Doing exercises and stretching.  Putting ice or heat on the affected area.  Medicines that help: ? To relieve pain and swelling. ? To relax your muscles.  Shots (injections) of medicines that help to relieve pain, irritation, and swelling.  Surgery. Follow these instructions at home: Medicines  Take over-the-counter and prescription medicines only as told by your doctor.  Ask your doctor if the medicine prescribed to you: ? Requires you to avoid driving or using heavy machinery. ? Can cause trouble pooping (constipation). You may need to take these steps to prevent or treat trouble pooping:  Drink enough fluids to keep your pee (urine) pale yellow.  Take over-the-counter or prescription medicines.  Eat foods that are high in fiber. These include beans, whole grains, and fresh fruits and vegetables.  Limit foods that are high in fat and sugar. These include fried or sweet foods. Managing pain  If told, put ice on the affected area. ? Put ice in a plastic bag. ? Place a towel between your skin and the bag. ? Leave the ice on for 20 minutes, 2-3 times a day.  If told, put heat on the affected area. Use the heat source that your doctor tells you to use, such as a moist heat pack or a heating pad. ? Place a towel between  your skin and the heat source. ? Leave the heat on for 20-30 minutes. ? Remove the heat if your skin turns bright red. This is very important if you are unable to feel pain, heat, or cold. You may have a greater risk of getting burned.      Activity  Return to your normal activities as told by your doctor. Ask your doctor what activities are safe for you.  Avoid activities that make your symptoms worse.  Take short rests during the day. ? When you rest for a long time, do some physical activity or stretching between periods of rest. ? Avoid sitting for a long time without moving. Get up and move around at least one time each hour.  Exercise and stretch regularly, as told by your doctor.  Do not lift anything that is heavier than 10 lb (4.5 kg) while you have symptoms of sciatica. ? Avoid lifting heavy things even when you do not have symptoms. ? Avoid lifting heavy things over and over.  When you lift objects, always lift in a way that is safe for your body. To do this, you should: ? Bend your knees. ? Keep the object close to your body. ? Avoid twisting.   General instructions  Stay at a healthy weight.  Wear comfortable shoes that support your feet. Avoid wearing high heels.  Avoid sleeping on a mattress that is too soft or too hard. You might have less pain if you sleep on a mattress that is firm enough to support your back.  Keep all follow-up visits as told by your doctor. This is important. Contact a doctor if:  You have pain that: ? Wakes you up when you are sleeping. ? Gets worse when you lie down. ? Is worse than the pain you have had in the past. ? Lasts longer than 4 weeks.  You lose weight without trying. Get help right away if:  You cannot control when you pee (urinate) or poop (have a bowel movement).  You have weakness in any of these areas and it gets worse: ? Lower back. ? The area between your hip bones. ? Butt. ? Legs.  You have redness or  swelling of your back.  You have a burning feeling when you pee. Summary  Sciatica is pain, weakness, tingling, or loss of feeling (numbness) along the sciatic nerve.  This condition happens when the sciatic nerve is pinched or has pressure put on it.  Sciatica can cause pain, tingling, or loss of feeling (numbness) in the lower back, legs, hips, and butt.  Treatment often includes rest, exercise, medicines, and putting ice or heat on the affected area. This information is not intended to replace advice given to you by your health care provider. Make sure you discuss any questions you have with your health care provider. Document Revised: 01/16/2018 Document  Reviewed: 01/16/2018 Elsevier Patient Education  2021 [employer].

## 2020-04-17 NOTE — Progress Notes (Addendum)
  Patient Name: Marissa Wright Date of Birth: [birth year] [place] Prenatal Visit  Marissa Wright is a 25 y.o. G1P0 at [redacted]w[redacted]d here for routine follow up. She is dated by early ultrasound.  She reports headache and sciatica.  She denies vaginal bleeding.  She is now feeling flutters inside her belly.  See flow sheet for details.  Headaches-patient reports headache started 1 month ago.  They are retro-orbital and temporal in nature.  They occur every day at 12 PM when she is leaving work.  8/10 severity.  They think that really helps her is a heat pack.  She endorses drinking enough fluids, eats 3 meals a day with plenty of snacks.  No associated phono photophobia, neck stiffness, vomiting, vision changes.  She did have tension headaches prior to pregnancy.  Sciatica-patient reports left-sided buttock pain which radiates down the left leg after sitting down. Occurs 1-2 a day.  Improves with walking.  Vitals:   04/17/20 1330  BP: (!) 94/58  Pulse: 94     A/P: Pregnancy at [redacted]w[redacted]d.  Doing well.    1. Routine Prenatal Care:  Marland Kitchen Dating reviewed, dating tab is correct . Fetal heart tones Appropriate  . Influenza vaccine not administered as patient declined, will continue to discuss.   Marland Kitchen COVID vaccination was discussed and patient declined.  . The patient has the following indication for screening preexisting diabetes: Reviewed indications for early 1 hour glucose testing, not indicated . Marland Kitchen Anatomy ultrasound ordered to be scheduled at 04/23/2020. Marland Kitchen Patient is not interested in genetic screening. As she is past 13 weeks and 6 days, a Quad screen  was offered but declined.  . Pregnancy education including expected weight gain in pregnancy, OTC medication use, continued use of prenatal vitamin, smoking cessation if applicable, and nutrition in pregnancy.   . Bleeding and pain precautions reviewed.  2. Pregnancy issues include the following and were addressed as appropriate  today:  . Nausea: resolved ended at the beginning of second trimester . Headache-likely tension headaches.  Cranial nerve exam unremarkable. Recommended conservative management-Tylenol, ice pack, heat pack.  Strict safety precautions provided to return to MD. . Sciatica-recommended gentle stretching . THC intake: no longer smoking.  Congratulated patient . Cell-free DNA testing. This was discussed at the previous visit.  Patient declined this today.  She is happy to get tetra alpha-fetoprotein to improve accuracy of first trimester screen. . Problem list  and pregnancy box updated: Yes.   Follow up 4 weeks.

## 2020-04-17 NOTE — Addendum Note (Signed)
Addended by: Thana Farr on: 04/17/2020 02:48 PM   Modules accepted: Orders

## 2020-04-19 LAB — AFP TETRA
DIA Mom Value: 1.5
DIA Value (EIA): 260.82 pg/mL
DSR (By Age)    1 IN: 1111
DSR (Second Trimester) 1 IN: 7711
Gestational Age: 18 WEEKS
MSAFP Mom: 0.79
MSAFP: 41.2 ng/mL
MSHCG Mom: 0.55
MSHCG: 18909 m[IU]/mL
Maternal Age At EDD: 22.7 yr
Osb Risk: 10000
T18 (By Age): 1:4329 {titer}
Test Results:: NEGATIVE
Weight: 124 [lb_av]
uE3 Mom: 1.11
uE3 Value: 1.66 ng/mL

## 2020-04-20 ENCOUNTER — Encounter: Payer: Self-pay | Admitting: Family Medicine

## 2020-04-20 DIAGNOSIS — O26893 Other specified pregnancy related conditions, third trimester: Secondary | ICD-10-CM | POA: Insufficient documentation

## 2020-04-20 DIAGNOSIS — R87619 Unspecified abnormal cytological findings in specimens from cervix uteri: Secondary | ICD-10-CM | POA: Insufficient documentation

## 2020-04-20 DIAGNOSIS — Z6791 Unspecified blood type, Rh negative: Secondary | ICD-10-CM | POA: Insufficient documentation

## 2020-04-23 ENCOUNTER — Other Ambulatory Visit: Payer: Self-pay

## 2020-04-23 ENCOUNTER — Ambulatory Visit: Payer: 59 | Attending: Obstetrics and Gynecology

## 2020-04-23 ENCOUNTER — Ambulatory Visit: Payer: 59 | Admitting: *Deleted

## 2020-04-23 ENCOUNTER — Encounter: Payer: Self-pay | Admitting: *Deleted

## 2020-04-23 DIAGNOSIS — O99342 Other mental disorders complicating pregnancy, second trimester: Secondary | ICD-10-CM | POA: Diagnosis not present

## 2020-04-23 DIAGNOSIS — Z3402 Encounter for supervision of normal first pregnancy, second trimester: Secondary | ICD-10-CM | POA: Diagnosis present

## 2020-04-23 DIAGNOSIS — O360120 Maternal care for anti-D [Rh] antibodies, second trimester, not applicable or unspecified: Secondary | ICD-10-CM

## 2020-04-23 DIAGNOSIS — Z3689 Encounter for other specified antenatal screening: Secondary | ICD-10-CM | POA: Diagnosis present

## 2020-04-23 DIAGNOSIS — Z363 Encounter for antenatal screening for malformations: Secondary | ICD-10-CM

## 2020-04-23 DIAGNOSIS — Z3A19 19 weeks gestation of pregnancy: Secondary | ICD-10-CM

## 2020-04-29 ENCOUNTER — Encounter: Payer: Self-pay | Admitting: *Deleted

## 2020-05-13 ENCOUNTER — Telehealth: Payer: Self-pay | Admitting: Family Medicine

## 2020-05-13 ENCOUNTER — Encounter: Payer: Self-pay | Admitting: Family Medicine

## 2020-05-13 NOTE — Telephone Encounter (Signed)
Patient is calling and would like to know if she can have a letter written for her employer. She has an appointment on 05/28/20 but states she needs letter asap to continue working.   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 She would like to have this letter sent to her mychart.   The best call back with any questions is [phone].

## 2020-05-13 NOTE — Telephone Encounter (Signed)
Routed to PCP. Ilya Ess, CMA  

## 2020-05-20 ENCOUNTER — Encounter: Payer: 59 | Admitting: Family Medicine

## 2020-05-22 ENCOUNTER — Telehealth: Payer: Self-pay | Admitting: Family Medicine

## 2020-05-22 ENCOUNTER — Encounter: Payer: Self-pay | Admitting: Family Medicine

## 2020-05-22 NOTE — Telephone Encounter (Signed)
Patient dropped off forms for work needing to be completed. Once completed they will be faxed to [phone]. Last DOS: 04/17/2020. Patient has signed a ROI. Placing papers in the [facility] team folder. Thanks!

## 2020-05-23 ENCOUNTER — Encounter: Payer: 59 | Admitting: Family Medicine

## 2020-05-26 NOTE — Telephone Encounter (Signed)
Physician's part completed and placed in RN box up front

## 2020-05-26 NOTE — Telephone Encounter (Signed)
Clinical info completed on Work forms .  Place form in PCP's box for completion.  Salvatore Marvel, CMA

## 2020-05-28 ENCOUNTER — Encounter: Payer: 59 | Admitting: Family Medicine

## 2020-05-28 ENCOUNTER — Encounter: Payer: Self-pay | Admitting: Family Medicine

## 2020-05-28 ENCOUNTER — Ambulatory Visit (INDEPENDENT_AMBULATORY_CARE_PROVIDER_SITE_OTHER): Payer: 59 | Admitting: Family Medicine

## 2020-05-28 ENCOUNTER — Other Ambulatory Visit: Payer: Self-pay

## 2020-05-28 DIAGNOSIS — Z3402 Encounter for supervision of normal first pregnancy, second trimester: Secondary | ICD-10-CM

## 2020-05-28 NOTE — Patient Instructions (Addendum)
For any pregnancy-related emergencies, please go to the Maternity Admissions Unit in the [place] [facility] [place] will use hospital Entrance C.    Second Trimester of Pregnancy  The second trimester of pregnancy is from week 13 through week 27. This is months 4 through 6 of pregnancy. The second trimester is often a time when you feel your best. Your body has adjusted to being pregnant, and you begin to feel better physically. During the second trimester:  Morning sickness has lessened or stopped completely.  You may have more energy.  You may have an increase in appetite. The second trimester is also a time when the unborn baby (fetus) is growing rapidly. At the end of the sixth month, the fetus may be up to 12 inches long and weigh about 1 pounds. You will likely begin to feel the baby move (quickening) between 16 and 20 weeks of pregnancy. Body changes during your second trimester Your body continues to go through many changes during your second trimester. The changes vary and generally return to normal after the baby is born. Physical changes  Your weight will continue to increase. You will notice your lower abdomen bulging out.  You may begin to get stretch marks on your hips, abdomen, and breasts.  Your breasts will continue to grow and to become tender.  Dark spots or blotches (chloasma or mask of pregnancy) may develop on your face.  A dark line from your belly button to the pubic area (linea nigra) may appear.  You may have changes in your hair. These can include thickening of your hair, rapid growth, and changes in texture. Some people also have hair loss during or after pregnancy, or hair that feels dry or thin. Health changes  You may develop headaches.  You may have heartburn.  You may develop constipation.  You may develop hemorrhoids or swollen, bulging veins (varicose veins).  Your gums may bleed and may be sensitive to brushing  and flossing.  You may urinate more often because the fetus is pressing on your bladder.  You may have back pain. This is caused by: ? Weight gain. ? Pregnancy hormones that are relaxing the joints in your pelvis. ? A shift in weight and the muscles that support your balance. Follow these instructions at home: Medicines  Follow your health care provider's instructions regarding medicine use. Specific medicines may be either safe or unsafe to take during pregnancy. Do not take any medicines unless approved by your health care provider.  Take a prenatal vitamin that contains at least 600 micrograms (mcg) of folic acid. Eating and drinking  Eat a healthy diet that includes fresh fruits and vegetables, whole grains, good sources of protein such as meat, eggs, or tofu, and low-fat dairy products.  Avoid raw meat and unpasteurized juice, milk, and cheese. These carry germs that can harm you and your baby.  You may need to take these actions to prevent or treat constipation: ? Drink enough fluid to keep your urine pale yellow. ? Eat foods that are high in fiber, such as beans, whole grains, and fresh fruits and vegetables. ? Limit foods that are high in fat and processed sugars, such as fried or sweet foods. Activity  Exercise only as directed by your health care provider. Most people can continue their usual exercise routine during pregnancy. Try to exercise for 30 minutes at least 5 days a week. Stop exercising if you develop contractions in your uterus.  Stop exercising if you develop pain or cramping in the lower abdomen or lower back.  Avoid exercising if it is very hot or humid or if you are at a high altitude.  Avoid heavy lifting.  If you choose to, you may have sex unless your health care provider tells you not to. Relieving pain and discomfort  Wear a supportive bra to prevent discomfort from breast tenderness.  Take warm sitz baths to soothe any pain or discomfort caused by  hemorrhoids. Use hemorrhoid cream if your health care provider approves.  Rest with your legs raised (elevated) if you have leg cramps or low back pain.  If you develop varicose veins: ? Wear support hose as told by your health care provider. ? Elevate your feet for 15 minutes, 3-4 times a day. ? Limit salt in your diet. Safety  Wear your seat belt at all times when driving or riding in a car.  Talk with your health care provider if someone is verbally or physically abusive to you. Lifestyle  Do not use hot tubs, steam rooms, or saunas.  Do not douche. Do not use tampons or scented sanitary pads.  Avoid cat litter boxes and soil used by cats. These carry germs that can cause birth defects in the baby and possibly loss of the fetus by miscarriage or stillbirth.  Do not use herbal remedies, alcohol, illegal drugs, or medicines that are not approved by your health care provider. Chemicals in these products can harm your baby.  Do not use any products that contain nicotine or tobacco, such as cigarettes, e-cigarettes, and chewing tobacco. If you need help quitting, ask your health care provider. General instructions  During a routine prenatal visit, your health care provider will do a physical exam and other tests. He or she will also discuss your overall health. Keep all follow-up visits. This is important.  Ask your health care provider for a referral to a local prenatal education class.  Ask for help if you have counseling or nutritional needs during pregnancy. Your health care provider can offer advice or refer you to specialists for help with various needs. Where to find more information  American Pregnancy Association: americanpregnancy.[place] and Gynecologists: PoolDevices.com.pt  Office on [employer]: KeywordPortfolios.com.br Contact a health care provider if you have:  A headache that does not go away when you take  medicine.  Vision changes or you see spots in front of your eyes.  Mild pelvic cramps, pelvic pressure, or nagging pain in the abdominal area.  Persistent nausea, vomiting, or diarrhea.  A bad-smelling vaginal discharge or foul-smelling urine.  Pain when you urinate.  Sudden or extreme swelling of your face, hands, ankles, feet, or legs.  A fever. Get help right away if you:  Have fluid leaking from your vagina.  Have spotting or bleeding from your vagina.  Have severe abdominal cramping or pain.  Have difficulty breathing.  Have chest pain.  Have fainting spells.  Have not felt your baby move for the time period told by your health care provider.  Have new or increased pain, swelling, or redness in an arm or leg. Summary  The second trimester of pregnancy is from week 13 through week 27 (months 4 through 6).  Do not use herbal remedies, alcohol, illegal drugs, or medicines that are not approved by your health care provider. Chemicals in these products can harm your baby.  Exercise only as directed by your health care provider. Most  people can continue their usual exercise routine during pregnancy.  Keep all follow-up visits. This is important. This information is not intended to replace advice given to you by your health care provider. Make sure you discuss any questions you have with your health care provider. Document Revised: 06/06/2019 Document Reviewed: 04/12/2019 Elsevier Patient Education  2021 [employer].

## 2020-05-28 NOTE — Progress Notes (Signed)
  [place] Prenatal Visit  Marissa Wright is a 25 y.o. G1P0 at [redacted]w[redacted]d here for routine follow up. She is dated by 6 week ultrasound.     She was seen last on 02/13/20 for pregnancy confirmation. Initial labs were drawn, but she has not been seen since.  She reports backache and caffeine headaches. Drinks about 1 cup of coffee a day.  She reports good fetal movement. No bleeding, loss of fluid, contractions. See flow sheet for details. Vitals:   05/28/20 0837  BP: 102/62  Pulse: 91     A/P: Pregnancy at [redacted]w[redacted]d.  Doing well.   . Dating reviewed, dating tab is correct . Fetal heart tones Appropriate . Fundal height within expected range.  . Anatomy ultrasound reviewed and no concerning findings.  . Influenza vaccine not administered as not influenza season. .  . COVID vaccination was discussed and she declines.  . Indications for screening for preexisting diabetes include: Reviewed indications for early 1 hour glucose testing, not indicated  . Pregnancy education provided on the following topics: fetal growth and movement, ultrasound assessment, and upcoming laboratory assessment.   Marland Kitchen Scheduled for [facility] during early third trimester on  Future Appointments  Date Time Provider [place]  07/03/2020  9:30 AM [facility] [place]   . Preterm labor precautions given.   2. Pregnancy issues include the following and were addressed as appropriate today:  . Problem list and pregnancy box updated: Yes.    Follow up 4 weeks.  Wilber Oliphant, M.D.  8:12 PM 05/28/2020

## 2020-07-01 NOTE — Progress Notes (Addendum)
[facility] Visit  Marissa Wright is a 25 y.o. G1P0 at [redacted]w[redacted]d (via 6 wk sono) who presents to [facility] for routine follow up. Prenatal course, history, notes, ultrasounds, and laboratory results reviewed.  Denies cramping/ctx, fluid leaking, vaginal bleeding, or decreased fetal movement. Taking PNV.   Does note one week of thick white vaginal discharge, itching, no foul odor. Denies a history of STIs but has had yeast and BV infections before.  Primary Prenatal Care Provider: Dr Edwina Barth  Postpartum Plans: - delivery planning: SVD, IV pain relief - circumcision: N/a girl - feeding: breast - pediatrician: [facility] - contraception: unsure, has tried pills before, discussed options today  FHR: 152 Uterine size: 28 cm  GU: Normal appearance of labia majora and minora, without lesions. Vagina tissue pink, moist, without lesions or abrasions. Cervix normal appearance, slightly erythematous, with thick white clumpy discharge appreciated. No odor to discharge appreciated.  Marissa Wright CMA as chaperone.   Assessment & Plan  1. Routine prenatal care: - Tdap vaccine given today - Varicella immunity unknown, checked today - COVID vaccination offered and discussed, she declined at this time - Passed 1 hr GTT today, 114 - check  Ab screen, CBC, HIV, RPR today  2. Vaginal discharge- wet prep + yeast, discussed topical vs PO treatment, discussed small association of birth defects with PO but more common in first trimester and felt generally safe in third trimester, she was in agreement and PO diflucan sent to pharmacy. GC/Chlamydia swabs negative.  3. Rh negative- Ab screen collected today and Rhogam given.   4. 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  Next prenatal visit in 2 weeks . Labor & fetal movement precautions discussed.  Marissa Savannah, MD [place]

## 2020-07-03 ENCOUNTER — Other Ambulatory Visit: Payer: Self-pay

## 2020-07-03 ENCOUNTER — Other Ambulatory Visit (HOSPITAL_COMMUNITY)
Admission: RE | Admit: 2020-07-03 | Discharge: 2020-07-03 | Disposition: A | Discharge status: Home / Self Care | Payer: 59 | Source: Ambulatory Visit | Attending: Family Medicine | Admitting: Family Medicine

## 2020-07-03 ENCOUNTER — Ambulatory Visit (INDEPENDENT_AMBULATORY_CARE_PROVIDER_SITE_OTHER): Payer: 59 | Admitting: Family Medicine

## 2020-07-03 VITALS — BP 115/63 | HR 96 | Wt 143.6 lb

## 2020-07-03 DIAGNOSIS — N898 Other specified noninflammatory disorders of vagina: Secondary | ICD-10-CM

## 2020-07-03 DIAGNOSIS — Z23 Encounter for immunization: Secondary | ICD-10-CM | POA: Diagnosis not present

## 2020-07-03 DIAGNOSIS — Z6791 Unspecified blood type, Rh negative: Secondary | ICD-10-CM | POA: Diagnosis not present

## 2020-07-03 DIAGNOSIS — Z3402 Encounter for supervision of normal first pregnancy, second trimester: Secondary | ICD-10-CM

## 2020-07-03 DIAGNOSIS — O26893 Other specified pregnancy related conditions, third trimester: Secondary | ICD-10-CM

## 2020-07-03 LAB — POCT WET PREP (WET MOUNT)
Clue Cells Wet Prep Whiff POC: NEGATIVE
Trichomonas Wet Prep HPF POC: ABSENT
WBC, Wet Prep HPF POC: >20

## 2020-07-03 LAB — POCT 1 HR PRENATAL GLUCOSE: Glucose 1 Hr Prenatal, POC: 114 mg/dL

## 2020-07-03 MED ORDER — RHO D IMMUNE GLOBULIN 1500 UNITS IM SOSY
300.0000 ug | PREFILLED_SYRINGE | Freq: Once | INTRAMUSCULAR | Status: AC
  Administered 2020-07-03: 300 ug via INTRAMUSCULAR

## 2020-07-03 NOTE — Patient Instructions (Addendum)
It was wonderful to see you today.  Please bring ALL of your medications with you to every visit.   Today we talked about:  - We checked your lab work today and did your sugar test  - we tested your for common infections that cause vaginal discharge, will call you with results   - We gave you a Rhogam shot today - we gave you a Tdap vaccination today - We discussed the COVID vaccine today, if you change your mind we are happy to give it to you and we do recommend for all of our pregnant mothers!     - Please schedule a follow up appointment in 2 weeks!     Pregnancy Related Return Precautions The follow are signs/symptoms that are abnormal in pregnancy and may require further evaluation by a physician: Go to the MAU at [place] at [facility] if: You have cramping/contractions that do not go away with drinking water, especially if they are lasting 30 seconds to 1.5 minutes, coming and going every 5-10 minutes for an hour or more, or are getting stronger and you cannot walk or talk while having a contraction/cramp. Your water breaks.  Sometimes it is a big gush of fluid, sometimes it is just a trickle that keeps getting your underwear wet or running down your legs You have vaginal bleeding.    You do not feel your baby moving like normal.  If you do not, get something to eat and drink (something cold or something with sugar like peanut butter or juice) and lay down and focus on feeling your baby move. If your baby is still not moving like normal, you should go to MAU. You should feel your baby move 6 times in one hour, or 10 times in two hours. You have a persistent headache that does not go away with 1 g of Tylenol, vision changes, chest pain, difficulty breathing, severe pain in your right upper abdomen, worsening leg swelling- these can all be signs of high blood pressure in pregnancy and need to be evaluated by a provider immediately  These are all concerning in pregnancy  and if you have any of these I recommend you call your PCP and present to the Maternity Admissions Unit (map below) for further evaluation.  For any pregnancy-related emergencies, please go to the Maternity Admissions Unit in the [place] at [place] will use hospital Entrance C.     Thank you for choosing [facility].   Please call [phone] with any questions about today's appointment.  Please be sure to schedule follow up at the front  desk before you leave today.   Yehuda Savannah, MD  Family Medicine

## 2020-07-04 LAB — HIV ANTIBODY (ROUTINE TESTING W REFLEX): HIV Screen 4th Generation wRfx: NONREACTIVE

## 2020-07-04 LAB — CBC
Hematocrit: 32.7 % — ABNORMAL LOW (ref 34.0–46.6)
Hemoglobin: 11.4 g/dL (ref 11.1–15.9)
MCH: 31.6 pg (ref 26.6–33.0)
MCHC: 34.9 g/dL (ref 31.5–35.7)
MCV: 91 fL (ref 79–97)
Platelets: 286 10*3/uL (ref 150–450)
RBC: 3.61 x10E6/uL — ABNORMAL LOW (ref 3.77–5.28)
RDW: 11.7 % (ref 11.7–15.4)
WBC: 9.3 10*3/uL (ref 3.4–10.8)

## 2020-07-04 LAB — RPR: RPR Ser Ql: NONREACTIVE

## 2020-07-04 LAB — VARICELLA ZOSTER ANTIBODY, IGG: Varicella zoster IgG: 298 index (ref >165)

## 2020-07-04 LAB — CERVICOVAGINAL ANCILLARY ONLY
Chlamydia: NEGATIVE
Comment: NEGATIVE
Comment: NORMAL
Neisseria Gonorrhea: NEGATIVE

## 2020-07-04 LAB — ANTIBODY SCREEN: Antibody Screen: NEGATIVE

## 2020-07-04 MED ORDER — FLUCONAZOLE 150 MG PO TABS: 150.0000 mg | ORAL_TABLET | Freq: Once | ORAL | 0 refills | Status: AC

## 2020-07-14 NOTE — Patient Instructions (Signed)
Third Trimester of Pregnancy  The third trimester of pregnancy is from week 28 through week 79. This is also called months 7 through 9. This trimester is when your unborn baby (fetus) is growing very fast. At the end of the ninth month, the unborn baby is about20 inches long. It weighs about 6-10 pounds. Body changes during your third trimester Your body continues to go through many changes during this time. The changesvary and generally return to normal after the baby is born. Physical changes Your weight will continue to increase. You may gain 25-35 pounds (11-16 kg) by the end of the pregnancy. If you are underweight, you may gain 28-40 lb (about 13-18 kg). If you are overweight, you may gain 15-25 lb (about 7-11 kg). You may start to get stretch marks on your hips, belly (abdomen), and breasts. Your breasts will continue to grow and may hurt. A yellow fluid (colostrum) may leak from your breasts. This is the first milk you are making for your baby. You may have changes in your hair. Your belly button may stick out. You may have more swelling in your hands, face, or ankles. Health changes You may have heartburn. You may have trouble pooping (constipation). You may get hemorrhoids. These are swollen veins in the butt that can itch or get painful. You may have swollen veins (varicose veins) in your legs. You may have more body aches in the pelvis, back, or thighs. You may have more tingling or numbness in your hands, arms, and legs. The skin on your belly may also feel numb. You may feel short of breath as your womb (uterus) gets bigger. Other changes You may pee (urinate) more often. You may have more problems sleeping. You may notice the unborn baby "dropping," or moving lower in your belly. You may have more discharge coming from your vagina. Your joints may feel loose, and you may have pain around your pelvic bone. Follow these instructions at home: Medicines Take  over-the-counter and prescription medicines only as told by your doctor. Some medicines are not safe during pregnancy. Take a prenatal vitamin that contains at least 600 micrograms (mcg) of folic acid. Eating and drinking Eat healthy meals that include: Fresh fruits and vegetables. Whole grains. Good sources of protein, such as meat, eggs, or tofu. Low-fat dairy products. Avoid raw meat and unpasteurized juice, milk, and cheese. These carry germs that can harm you and your baby. Eat 4 or 5 small meals rather than 3 large meals a day. You may need to take these actions to prevent or treat trouble pooping: Drink enough fluids to keep your pee (urine) pale yellow. Eat foods that are high in fiber. These include beans, whole grains, and fresh fruits and vegetables. Limit foods that are high in fat and sugar. These include fried or sweet foods. Activity Exercise only as told by your doctor. Stop exercising if you start to have cramps in your womb. Avoid heavy lifting. Do not exercise if it is too hot or too humid, or if you are in a place of great height (high altitude). If you choose to, you may have sex unless your doctor tells you not to. Relieving pain and discomfort Take breaks often, and rest with your legs raised (elevated) if you have leg cramps or low back pain. Take warm water baths (sitz baths) to soothe pain or discomfort caused by hemorrhoids. Use hemorrhoid cream if your doctor approves. Wear a good support bra if your breasts are tender.  If you develop bulging, swollen veins in your legs: Wear support hose as told by your doctor. Raise your feet for 15 minutes, 3-4 times a day. Limit salt in your food. Safety Talk to your doctor before traveling far distances. Do not use hot tubs, steam rooms, or saunas. Wear your seat belt at all times when you are in a car. Talk with your doctor if someone is hurting you or yelling at you a lot. Preparing for your baby's arrival To  prepare for the arrival of your baby: Take prenatal classes. Visit the hospital and tour the maternity area. Buy a rear-facing car seat. Learn how to install it in your car. Prepare the baby's room. Take out all pillows and stuffed animals from the baby's crib. General instructions Avoid cat litter boxes and soil used by cats. These carry germs that can cause harm to the baby and can cause a loss of your baby by miscarriage or stillbirth. Do not douche or use tampons. Do not use scented sanitary pads. Do not smoke or use any products that contain nicotine or tobacco. If you need help quitting, ask your doctor. Do not drink alcohol. Do not use herbal medicines, illegal drugs, or medicines that were not approved by your doctor. Chemicals in these products can affect your baby. Keep all follow-up visits. This is important. Where to find more information American Pregnancy Association: americanpregnancy.org [employer] of Obstetricians and Gynecologists: www.acog.org Office on Women's Health: KeywordPortfolios.com.br Contact a doctor if: You have a fever. You have mild cramps or pressure in your lower belly. You have a nagging pain in your belly area. You vomit, or you have watery poop (diarrhea). You have bad-smelling fluid coming from your vagina. You have pain when you pee, or your pee smells bad. You have a headache that does not go away when you take medicine. You have changes in how you see, or you see spots in front of your eyes. Get help right away if: Your water breaks. You have regular contractions that are less than 5 minutes apart. You are spotting or bleeding from your vagina. You have very bad belly cramps or pain. You have trouble breathing. You have chest pain. You faint. You have not felt the baby move for the amount of time told by your doctor. You have new or increased pain, swelling, or redness in an arm or leg. Summary The third trimester is from week 28  through week 40 (months 7 through 9). This is the time when your unborn baby is growing very fast. During this time, your discomfort may increase as you gain weight and as your baby grows. Get ready for your baby to arrive by taking prenatal classes, buying a rear-facing car seat, and preparing the baby's room. Get help right away if you are bleeding from your vagina, you have chest pain and trouble breathing, or you have not felt the baby move for the amount of time told by your doctor. This information is not intended to replace advice given to you by your health care provider. Make sure you discuss any questions you have with your healthcare provider. Document Revised: 06/06/2019 Document Reviewed: 04/12/2019 Elsevier Patient Education  2022 [employer].

## 2020-07-14 NOTE — Progress Notes (Signed)
  [place] Prenatal Visit  Marissa Wright is a 25 y.o. G1P0 at [redacted]w[redacted]d here for routine follow up. She is dated by early ultrasound.  She reports no complaints.  She reports fetal movement. She denies vaginal bleeding, contractions, or loss of fluid.  See flow sheet for details.  Vitals:   07/15/20 1435  BP: (!) 94/56   Blood pressure recheck 100/58  A/P: Pregnancy at [redacted]w[redacted]d.  Doing well.   Routine prenatal care:  Dating reviewed, dating tab is correct Fetal heart tones: Appropriate Fundal height: within expected range.  The patient does not have a history of HSV and valacyclovir is not indicated at this time.  The patient does not have a history of Cesarean delivery and no referral to [facility] [place] is indicated Infant feeding choice: Breastfeeding Contraception choice: Undecided  Infant circumcision desired not applicable Influenza vaccine not administered as not influenza season.   Tdap was not given today. (Given 07/03/20) COVID vaccination was discussed and Not interested. .  Pregnancy education regarding benefits of breastfeeding, contraception, fetal growth, expected weight gain, and safe infant sleep were discussed.  Preterm labor and fetal movement precautions reviewed.   2. Pregnancy issues include the following and were addressed as appropriate today:  No concerns today.  We will follow-up in 2 weeks with Dr. Ginette Pitman  Problem list and pregnancy box updated: Yes.    Follow up 2 weeks, this has been scheduled.

## 2020-07-15 ENCOUNTER — Ambulatory Visit (INDEPENDENT_AMBULATORY_CARE_PROVIDER_SITE_OTHER): Payer: 59 | Admitting: Family Medicine

## 2020-07-15 ENCOUNTER — Other Ambulatory Visit: Payer: Self-pay

## 2020-07-15 VITALS — BP 94/56 | Wt 145.2 lb

## 2020-07-15 DIAGNOSIS — Z3480 Encounter for supervision of other normal pregnancy, unspecified trimester: Secondary | ICD-10-CM

## 2020-07-15 NOTE — Progress Notes (Signed)
Repeat Left arm- 100 /58.

## 2020-07-29 ENCOUNTER — Ambulatory Visit (INDEPENDENT_AMBULATORY_CARE_PROVIDER_SITE_OTHER): Payer: 59 | Admitting: Family Medicine

## 2020-07-29 ENCOUNTER — Other Ambulatory Visit: Payer: Self-pay

## 2020-07-29 DIAGNOSIS — Z3403 Encounter for supervision of normal first pregnancy, third trimester: Secondary | ICD-10-CM

## 2020-07-29 NOTE — Progress Notes (Signed)
  [place] Prenatal Visit  Marissa Wright is a 25 y.o. G1P0 at [redacted]w[redacted]d here for routine follow up. She is dated by early ultrasound.  She reports no bleeding, no contractions, no cramping, and no leaking.  She reports fetal movement. She denies vaginal bleeding, contractions, or loss of fluid.  See flow sheet for details.  There were no vitals filed for this visit.   A/P: Pregnancy at [redacted]w[redacted]d.  Doing well.   Routine prenatal care:  Dating reviewed, dating tab is correct Fetal heart tones: Appropriate 132 Fundal height: within expected range.  33 cm The patient does not have a history of HSV and valacyclovir is not indicated at this time.  The patient does not have a history of Cesarean delivery and no referral to [facility] [place] is indicated Infant feeding choice: Breastfeeding Contraception choice: Undecided  OCPs vs IUD but is planning on waiting until after breast feeding  Infant circumcision NA Influenza vaccine not administered as not influenza season.   Tdap was not given today. Previously given  COVID vaccination was discussed and does not wish to get the vaccine.  Childbirth and education classes were offered. Pregnancy education regarding benefits of breastfeeding, contraception, fetal growth, expected weight gain, and safe infant sleep were discussed.  Preterm labor and fetal movement precautions reviewed.   2. 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   Scheduled for [place] clinic in third trimester on 09/04/2020.   Follow up 2 weeks.

## 2020-07-29 NOTE — Patient Instructions (Signed)
It was great seeing you today.  I am glad that you are doing well and have no major concerns at this time.  We have scheduled you for an appointment with our faculty OB clinic in late August.  I also scheduled you to see me on August 2.  If you have any issues between now and then such as vaginal bleeding, contractions, gush of fluid, decreased fetal movement please seek assessment in the maternal assessment unit in the bottom of the [facility].  If you have any questions or concerns please feel free to call the clinic.  I hope you have a wonderful afternoon!

## 2020-08-12 ENCOUNTER — Other Ambulatory Visit: Payer: Self-pay

## 2020-08-12 ENCOUNTER — Ambulatory Visit (INDEPENDENT_AMBULATORY_CARE_PROVIDER_SITE_OTHER): Payer: 59 | Admitting: Family Medicine

## 2020-08-12 DIAGNOSIS — Z3403 Encounter for supervision of normal first pregnancy, third trimester: Secondary | ICD-10-CM

## 2020-08-12 NOTE — Progress Notes (Signed)
  [place] Prenatal Visit  Kolie Rissman is a 25 y.o. G1P0 at 18w1dhere for routine follow up. She is dated by LMP, early ultrasound.  She reports no bleeding, no contractions, no cramping, and no leaking.  She reports fetal movement. She denies vaginal bleeding, contractions, or loss of fluid.  See flow sheet for details.  Vitals:   08/12/20 1359  BP: 104/60  Pulse: 94     A/P: Pregnancy at 335w1d Doing well.   Routine prenatal care:  Dating reviewed, dating tab is correct Fetal heart tones: Appropriate 127 Fundal height: within expected range. 36 The patient does not have a history of HSV and valacyclovir is not indicated at this time.  The patient does not have a history of Cesarean delivery and no referral to [facility] indicated Infant feeding choice: Breastfeeding Contraception choice: Pills  Infant circumcision desired not applicable Influenza vaccine not administered as not influenza season.   Tdap was not given today. COVID vaccination was discussed and patient does not wish to get the COVID-vaccine at this time.  Childbirth and education classes were offered. Pregnancy education regarding benefits of breastfeeding, contraception, fetal growth, expected weight gain, and safe infant sleep were discussed.  Preterm labor and fetal movement precautions reviewed.   2. 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   [facility] third trimester on 8/25.   Follow up 2 weeks.

## 2020-08-12 NOTE — Patient Instructions (Signed)
It was great seeing you today.  I am glad your pregnancy is going well.  We have scheduled you for your next appointment next Tuesday.  If you have any issues with contractions, vaginal bleeding, gush of fluid, feeling decreased baby movement please be evaluated the maternal assessment unit.  I hope you have a wonderful afternoon!

## 2020-08-19 ENCOUNTER — Other Ambulatory Visit: Payer: Self-pay

## 2020-08-19 ENCOUNTER — Other Ambulatory Visit (HOSPITAL_COMMUNITY)
Admission: RE | Admit: 2020-08-19 | Discharge: 2020-08-19 | Disposition: A | Discharge status: Home / Self Care | Payer: 59 | Source: Ambulatory Visit | Attending: Family Medicine | Admitting: Family Medicine

## 2020-08-19 ENCOUNTER — Ambulatory Visit (INDEPENDENT_AMBULATORY_CARE_PROVIDER_SITE_OTHER): Payer: 59 | Admitting: Family Medicine

## 2020-08-19 VITALS — BP 96/54 | HR 104 | Wt 154.0 lb

## 2020-08-19 DIAGNOSIS — Z3480 Encounter for supervision of other normal pregnancy, unspecified trimester: Secondary | ICD-10-CM | POA: Insufficient documentation

## 2020-08-19 NOTE — Progress Notes (Signed)
  [place] Prenatal Visit  Marissa Wright is a 25 y.o. G1P0 at 21w1dhere for routine follow up. She is dated by early ultrasound. She reports good fetal movement. She denies vaginal bleeding, contractions, or loss of fluid. See flow sheet for details.  Vitals:   08/19/20 1112  BP: (!) 96/54  Pulse: (!) 104    A/P: Pregnancy at 372w1d Doing well.   Routine prenatal care  Dating reviewed, dating tab is correct Fetal heart tones Appropriate Fundal height within expected range.  Fetal position confirmed Vertex using Ultrasound .  GBS collected today. .  Repeat GC/CT collected today.  The patient does not have a history of HSV and valacyclovir is not indicated at this time.  Infant feeding choice: Breastfeeding Contraception choice: Pills - wants to wait until she's done breastfeeding Infant circumcision n/a Influenza vaccine not administered as not influenza season.   Tdap previously administered between 27-36 weeks  COVID vaccination was discussed and patient declines.  Pregnancy education regarding preterm labor, fetal movement,  benefits of breastfeeding, contraception, fetal growth, expected weight gain, and safe infant sleep were discussed.    2. 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  Follow up 1 week.

## 2020-08-19 NOTE — Patient Instructions (Signed)
Pain Relief During Labor and Delivery Many things can cause pain during labor and delivery, including: Pressure due to the baby moving through the pelvis. Stretching of tissues due to the baby moving through the birth canal. Muscle tension due to anxiety or nervousness. The uterus tightening (contracting)and relaxing to help move the baby. How do I get pain relief during labor and delivery?  Discuss your pain relief options with your health care provider during your prenatal visits. Explore the options offered by your hospital or birth center. There are many ways to deal with the pain of labor and delivery. You can try relaxation techniques or doing relaxing activities, taking a warm shower or bath (hydrotherapy), or other methods. There are also many medicines available to help controlpain. Relaxation techniques and activities Practice relaxation techniques or do relaxing activities, such as: Focused breathing. Meditation. Visualization. Aroma therapy. Listening to your favorite music. Hypnosis. Hydrotherapy Take a warm shower or bath. This may: Provide comfort and relaxation. Lessen your feeling of pain. Reduce the amount of pain medicine needed. Shorten the length of labor. Other methods Try doing other things, such as: Getting a massage or having counterpressure on your back. Applying warm packs or ice packs. Changing positions often, moving around, or using a birthing ball. Medicines You may be given: Pain medicine through an IV or an injection into a muscle. Pain medicine inserted into your spinal column. Injections of sterile water just under the skin on your lower back. Nitrous oxide inhalation therapy, also called laughing gas. What kinds of medicine are available for pain relief? There are two kinds of medicines that can be used to relieve pain during labor and delivery: Analgesics. These medicines decrease pain without causing you to lose feeling or the ability to move  your muscles. Anesthetics. These medicines block feeling in the body and can decrease your ability to move freely. Both kinds of medicine can cause minor side effects, such as nausea, trouble concentrating, and sleepiness. They can also affect the baby's heart rate before birth and his or her breathing after birth. For this reason, health careproviders are careful about when and how much medicine is given. Which medicines are used to provide pain relief? Common medicines The most common medicines used to help manage pain during labor and delivery include: Opioids. Opioids are medicines that decrease how much pain you feel (perception of pain). These medicines can be given through an IV or may be used with anesthetics to block pain. Epidural analgesia. Epidural analgesia is given through a very thin tube that is inserted into the lower back. Medicine is delivered continuously to the area near your spinal column nerves (epidural space). After having this treatment, you may be able to move your legs, but you will not be able to walk. Depending on the amount and type of medicine given, you may lose all feeling in the lower half of your body, or you may have some sensation, including the urge to push. This treatment can be used to give pain relief for a vaginal birth. Sometimes, a numbing medicine is injected into the spinal fluid when an epidural catheter is placed. This provides for immediate relief but only lasts for 1-2 hours. Once it wears off, the epidural will provide pain relief. This is called a combined spinal-epidural (CSE) block. Intrathecal analgesia (spinal analgesia). Intrathecal analgesia is similar to epidural analgesia, but the medicine is injected into the spinal fluid instead of the epidural space. It is usually only given once. It starts  to relieve pain quickly, but the pain relief lasts only 1-2 hours. Pudendal block. This block is done by injecting numbing medicine through the wall of  the vagina and into a nerve in the pelvis. Other medicines Other medicines used to help manage pain during labor and delivery include: Local anesthetics. These are used to numb a small area of the body. They may be used along with another kind of medicine or used to numb the nerves of the vagina, cervix, and perineum during the second stage of labor. Spinal block (spinal anesthesia). Spinal anesthesia is similar to spinal analgesia, but the medicine that is used contains longer-acting numbing medicines and pain medicines. This type of anesthesia can be used for a cesarean delivery and allows you to stay awake for the birth of your baby. General anesthetics cause you to lose consciousness so you do not feel pain. They are usually only used for an emergency cesarean delivery. These medicines are given through an IV or a mask or both. These medicines are used as part of a procedure or for an emergency delivery. Summary Women have many options to help them manage the pain associated with labor and delivery. You can try doing relaxing activities, taking a warm shower or bath, or other methods. There are also many medicines available to help control pain during labor and delivery. Talk with your health care provider about what options are available to you. This information is not intended to replace advice given to you by your health care provider. Make sure you discuss any questions you have with your healthcare provider. Document Revised: 11/15/2018 Document Reviewed: 11/15/2018 Elsevier Patient Education  [place].

## 2020-08-20 LAB — CERVICOVAGINAL ANCILLARY ONLY
Chlamydia: NEGATIVE
Comment: NEGATIVE
Comment: NORMAL
Neisseria Gonorrhea: NEGATIVE

## 2020-08-22 LAB — CULTURE, BETA STREP (GROUP B ONLY): Strep Gp B Culture: POSITIVE — AB

## 2020-08-26 ENCOUNTER — Other Ambulatory Visit: Payer: Self-pay

## 2020-08-26 ENCOUNTER — Ambulatory Visit (INDEPENDENT_AMBULATORY_CARE_PROVIDER_SITE_OTHER): Payer: 59 | Admitting: Family Medicine

## 2020-08-26 VITALS — BP 98/65 | HR 86 | Wt 155.2 lb

## 2020-08-26 DIAGNOSIS — Z3480 Encounter for supervision of other normal pregnancy, unspecified trimester: Secondary | ICD-10-CM

## 2020-08-26 NOTE — Patient Instructions (Addendum)
It was great seeing you today.  Regarding your test results from your last visit you were GBS positive meaning you will need antibiotics during delivery.  If you have any vaginal bleeding, gush of fluid, regular contractions, decreased baby movement please be assessed on the maternal assessment unit.  If you have any questions or concerns please call the clinic.  You have a visit scheduled for next week as well as the following week.  I hope you have a wonderful day!

## 2020-08-26 NOTE — Progress Notes (Signed)
  [facility] Prenatal Visit  Marissa Wright is a 25 y.o. G1P0 at 23w1dhere for routine follow up. She is dated by early ultrasound.  She reports no bleeding, no contractions, and no leaking. She reports fetal movement. She denies vaginal bleeding, contractions, or loss of fluid. See flow sheet for details.  There were no vitals filed for this visit.  A/P: Pregnancy at 339w1d Doing well.   Routine prenatal care  Dating reviewed, dating tab is correct Fetal heart tones Appropriate 143 Fundal height within expected range.  37.5 Fetal position confirmed Vertex using Leopold's .  GBS not collected today due to previously collected. .   GBS positive  Repeat GC/CT not collected today due to previously collected.  Negative  The patient does have a history of HSV and valacyclovir is not indicated at this time.  Infant feeding choice: Breastfeeding Contraception choice: Undecided , Planing on OCPs or IUD after breast feeding. Discussed with patient  Infant circumcision desired not applicable Influenza vaccine not administered as not influenza season.   Tdap previously administered between 27-36 weeks  COVID vaccination was discussed and declines at this time .  Pregnancy education regarding preterm labor, fetal movement,  benefits of breastfeeding, contraception, fetal growth, expected weight gain, and safe infant sleep were discussed.    2. 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  Follow up 1 week.

## 2020-09-04 ENCOUNTER — Ambulatory Visit (INDEPENDENT_AMBULATORY_CARE_PROVIDER_SITE_OTHER): Payer: 59 | Admitting: Family Medicine

## 2020-09-04 ENCOUNTER — Other Ambulatory Visit: Payer: Self-pay

## 2020-09-04 DIAGNOSIS — O99820 Streptococcus B carrier state complicating pregnancy: Secondary | ICD-10-CM | POA: Insufficient documentation

## 2020-09-04 DIAGNOSIS — Z3493 Encounter for supervision of normal pregnancy, unspecified, third trimester: Secondary | ICD-10-CM

## 2020-09-04 DIAGNOSIS — Z3A38 38 weeks gestation of pregnancy: Secondary | ICD-10-CM | POA: Diagnosis not present

## 2020-09-04 NOTE — Patient Instructions (Addendum)
Make a follow up in 1 week. We are scheduling your ultrasound BPP for 09/16/2020.   Pregnancy Related Return Precautions The follow are signs/symptoms that are abnormal in pregnancy and may require further evaluation by a physician: Go to the MAU at [place] at [facility] if: You have cramping/contractions that do not go away with drinking water, especially if they are lasting 30 seconds to 1.5 minutes, coming and going every 5-10 minutes for an hour or more, or are getting stronger and you cannot walk or talk while having a contraction/cramp. Your water breaks.  Sometimes it is a big gush of fluid, sometimes it is just a trickle that keeps getting your underwear wet or running down your legs You have vaginal bleeding.    You do not feel your baby moving like normal.  If you do not, get something to eat and drink (something cold or something with sugar like peanut butter or juice) and lay down and focus on feeling your baby move. If your baby is still not moving like normal, you should go to MAU. You should feel your baby move 6 times in one hour, or 10 times in two hours. You have a persistent headache that does not go away with 1 g of Tylenol, vision changes, chest pain, difficulty breathing, severe pain in your right upper abdomen, worsening leg swelling- these can all be signs of high blood pressure in pregnancy and need to be evaluated by a provider immediately  These are all concerning in pregnancy and if you have any of these I recommend you call your PCP and present to the Maternity Admissions Unit (map below) for further evaluation.  For any pregnancy-related emergencies, please go to the Maternity Admissions Unit in the [place] at [place] will use hospital Entrance C.

## 2020-09-04 NOTE — Progress Notes (Signed)
[place] [facility] Visit  Marissa Wright is a 25 y.o. G1P0 at [redacted]w[redacted]d(via 6Sharene Skeans [facility] P[facility]atal course, history, notes, ultrasounds, and laboratory results reviewed.  Denies cramping/ctx, fluid leaking, vaginal bleeding, or decreased fetal movement. Taking PNV.    Primary Prenatal Care Provider: Dr WEdwina Barth Postpartum Plans: - delivery planning: SVD, IV pain medications - circumcision: girl, N/A - feeding: breast [facility]- contraception: POPs  FHR: 147 bpm Uterine size: 39 cm  Assessment & Plan  1. Routine prenatal care: - GBS positive- will need ampicillin in labor, no allergies to antibiotics - COVID vaccination offered, patient declined - BPP for post dates scheduled today - vertex by ultrasound today - SVE offered and declined  2. Rh negative- negative Ab screen and Rhogam given at 28 WGA. Will need Rhogam postpartum.  3. History of ACUS + HPV pap on 01/15/2020- needs repeat in 1 year.  Next prenatal visit in 1 weeks . Labor & fetal movement precautions discussed.  Please schedule induction at follow up visit for 40.5-41.0 WGA.  MYehuda Savannah MD [facility]

## 2020-09-08 ENCOUNTER — Inpatient Hospital Stay (HOSPITAL_COMMUNITY)
Admission: AD | Admit: 2020-09-08 | Discharge: 2020-09-11 | DRG: 807 | Disposition: A | Discharge status: Home / Self Care | Payer: 59 | Attending: Obstetrics and Gynecology | Admitting: Obstetrics and Gynecology

## 2020-09-08 ENCOUNTER — Other Ambulatory Visit: Payer: Self-pay

## 2020-09-08 ENCOUNTER — Inpatient Hospital Stay (HOSPITAL_COMMUNITY)
Admission: AD | Admit: 2020-09-08 | Discharge: 2020-09-08 | Disposition: A | Discharge status: Home / Self Care | Payer: 59 | Source: Home / Self Care | Attending: Family Medicine | Admitting: Family Medicine

## 2020-09-08 ENCOUNTER — Inpatient Hospital Stay (HOSPITAL_COMMUNITY): Payer: 59 | Admitting: Anesthesiology

## 2020-09-08 ENCOUNTER — Encounter (HOSPITAL_COMMUNITY): Payer: Self-pay | Admitting: Obstetrics & Gynecology

## 2020-09-08 ENCOUNTER — Encounter (HOSPITAL_COMMUNITY): Payer: Self-pay | Admitting: Family Medicine

## 2020-09-08 DIAGNOSIS — O99824 Streptococcus B carrier state complicating childbirth: Secondary | ICD-10-CM | POA: Diagnosis present

## 2020-09-08 DIAGNOSIS — O26893 Other specified pregnancy related conditions, third trimester: Secondary | ICD-10-CM | POA: Diagnosis present

## 2020-09-08 DIAGNOSIS — Z6791 Unspecified blood type, Rh negative: Secondary | ICD-10-CM | POA: Diagnosis not present

## 2020-09-08 DIAGNOSIS — Z20822 Contact with and (suspected) exposure to covid-19: Secondary | ICD-10-CM | POA: Diagnosis present

## 2020-09-08 DIAGNOSIS — Z3403 Encounter for supervision of normal first pregnancy, third trimester: Secondary | ICD-10-CM

## 2020-09-08 DIAGNOSIS — O471 False labor at or after 37 completed weeks of gestation: Secondary | ICD-10-CM | POA: Insufficient documentation

## 2020-09-08 DIAGNOSIS — O99820 Streptococcus B carrier state complicating pregnancy: Secondary | ICD-10-CM | POA: Diagnosis not present

## 2020-09-08 DIAGNOSIS — Z3A39 39 weeks gestation of pregnancy: Secondary | ICD-10-CM

## 2020-09-08 DIAGNOSIS — Z87891 Personal history of nicotine dependence: Secondary | ICD-10-CM | POA: Diagnosis not present

## 2020-09-08 DIAGNOSIS — O479 False labor, unspecified: Secondary | ICD-10-CM

## 2020-09-08 LAB — RESP PANEL BY RT-PCR (FLU A&B, COVID) ARPGX2
Influenza A by PCR: NEGATIVE
Influenza B by PCR: NEGATIVE
SARS Coronavirus 2 by RT PCR: NEGATIVE

## 2020-09-08 LAB — CBC
HCT: 38.9 % (ref 36.0–46.0)
Hemoglobin: 13 g/dL (ref 12.0–15.0)
MCH: 28.9 pg (ref 26.0–34.0)
MCHC: 33.4 g/dL (ref 30.0–36.0)
MCV: 86.4 fL (ref 80.0–100.0)
Platelets: 287 10*3/uL (ref 150–400)
RBC: 4.5 MIL/uL (ref 3.87–5.11)
RDW: 13.4 % (ref 11.5–15.5)
WBC: 12 10*3/uL — ABNORMAL HIGH (ref 4.0–10.5)
nRBC: 0 % (ref 0.0–0.2)

## 2020-09-08 LAB — TYPE AND SCREEN
ABO/RH(D): A NEG
Antibody Screen: POSITIVE

## 2020-09-08 MED ORDER — LIDOCAINE HCL (PF) 1 % IJ SOLN
INTRAMUSCULAR | Status: DC | PRN
  Administered 2020-09-08: 8 mL via EPIDURAL

## 2020-09-08 MED ORDER — OXYTOCIN-SODIUM CHLORIDE 30-0.9 UT/500ML-% IV SOLN
2.5000 [IU]/h | INTRAVENOUS | Status: DC
  Filled 2020-09-08: qty 500

## 2020-09-08 MED ORDER — OXYTOCIN BOLUS FROM INFUSION
333.0000 mL | Freq: Once | INTRAVENOUS | Status: AC
  Administered 2020-09-09: 333 mL via INTRAVENOUS

## 2020-09-08 MED ORDER — LACTATED RINGERS IV SOLN
500.0000 mL | Freq: Once | INTRAVENOUS | Status: AC
  Administered 2020-09-08: 500 mL via INTRAVENOUS

## 2020-09-08 MED ORDER — EPHEDRINE 5 MG/ML INJ: 10.0000 mg | INTRAVENOUS | Status: DC | PRN

## 2020-09-08 MED ORDER — FENTANYL CITRATE (PF) 100 MCG/2ML IJ SOLN: 50.0000 ug | Freq: Once | INTRAMUSCULAR | Status: DC

## 2020-09-08 MED ORDER — LIDOCAINE HCL (PF) 1 % IJ SOLN: 30.0000 mL | INTRAMUSCULAR | Status: DC | PRN

## 2020-09-08 MED ORDER — ONDANSETRON HCL 4 MG/2ML IJ SOLN: 4.0000 mg | Freq: Once | INTRAMUSCULAR | Status: AC

## 2020-09-08 MED ORDER — FENTANYL-BUPIVACAINE-NACL 0.5-0.125-0.9 MG/250ML-% EP SOLN
12.0000 mL/h | EPIDURAL | Status: DC | PRN
  Administered 2020-09-08: 12 mL/h via EPIDURAL
  Filled 2020-09-08: qty 250

## 2020-09-08 MED ORDER — LACTATED RINGERS IV SOLN: 500.0000 mL | INTRAVENOUS | Status: DC | PRN

## 2020-09-08 MED ORDER — LACTATED RINGERS IV SOLN
INTRAVENOUS | Status: DC
  Administered 2020-09-08: 125 mL via INTRAVENOUS

## 2020-09-08 MED ORDER — PROMETHAZINE HCL 25 MG PO TABS: 25.0000 mg | ORAL_TABLET | Freq: Four times a day (QID) | ORAL | 0 refills | Status: DC | PRN

## 2020-09-08 MED ORDER — OXYCODONE-ACETAMINOPHEN 5-325 MG PO TABS: 2.0000 | ORAL_TABLET | ORAL | Status: DC | PRN

## 2020-09-08 MED ORDER — ONDANSETRON HCL 4 MG/2ML IJ SOLN: 4.0000 mg | Freq: Four times a day (QID) | INTRAMUSCULAR | Status: DC | PRN

## 2020-09-08 MED ORDER — PROMETHAZINE HCL 25 MG PO TABS
25.0000 mg | ORAL_TABLET | Freq: Once | ORAL | Status: AC
  Administered 2020-09-08: 25 mg via ORAL
  Filled 2020-09-08: qty 1

## 2020-09-08 MED ORDER — SODIUM CHLORIDE 0.9 % IV SOLN
5.0000 10*6.[IU] | Freq: Once | INTRAVENOUS | Status: AC
  Administered 2020-09-08: 5 10*6.[IU] via INTRAVENOUS
  Filled 2020-09-08: qty 5

## 2020-09-08 MED ORDER — ACETAMINOPHEN 325 MG PO TABS: 650.0000 mg | ORAL_TABLET | ORAL | Status: DC | PRN

## 2020-09-08 MED ORDER — MORPHINE SULFATE (PF) 10 MG/ML IV SOLN
10.0000 mg | Freq: Once | INTRAVENOUS | Status: AC
Start: 2020-09-08 — End: 2020-09-08
  Administered 2020-09-08: 10 mg via INTRAMUSCULAR
  Filled 2020-09-08: qty 1

## 2020-09-08 MED ORDER — PHENYLEPHRINE 40 MCG/ML (10ML) SYRINGE FOR IV PUSH (FOR BLOOD PRESSURE SUPPORT): 80.0000 ug | PREFILLED_SYRINGE | INTRAVENOUS | Status: DC | PRN

## 2020-09-08 MED ORDER — PENICILLIN G POT IN DEXTROSE 60000 UNIT/ML IV SOLN
3.0000 10*6.[IU] | INTRAVENOUS | Status: DC
Start: 2020-09-08 — End: 2020-09-09
  Administered 2020-09-08: 3 10*6.[IU] via INTRAVENOUS
  Filled 2020-09-08: qty 50

## 2020-09-08 MED ORDER — OXYCODONE-ACETAMINOPHEN 5-325 MG PO TABS: 1.0000 | ORAL_TABLET | ORAL | Status: DC | PRN

## 2020-09-08 MED ORDER — DIPHENHYDRAMINE HCL 50 MG/ML IJ SOLN
12.5000 mg | INTRAMUSCULAR | Status: DC | PRN
Start: 2020-09-08 — End: 2020-09-09

## 2020-09-08 MED ORDER — ONDANSETRON 4 MG PO TBDP
4.0000 mg | ORAL_TABLET | Freq: Once | ORAL | Status: AC
  Administered 2020-09-08: 4 mg via ORAL
  Filled 2020-09-08: qty 1

## 2020-09-08 MED ORDER — SOD CITRATE-CITRIC ACID 500-334 MG/5ML PO SOLN: 30.0000 mL | ORAL | Status: DC | PRN

## 2020-09-08 NOTE — Discharge Instructions (Signed)
You may use phenergan for nausea. Please only take 1 every 6 hours as needed.

## 2020-09-08 NOTE — Anesthesia Preprocedure Evaluation (Addendum)
Anesthesia Evaluation  Patient identified by MRN, date of birth, ID band Patient awake    Reviewed: Allergy & Precautions, NPO status , Patient's Chart, lab work & pertinent test results  Airway Mallampati: II  TM Distance: >3 FB Neck ROM: Full    Dental no notable dental hx.    Pulmonary neg pulmonary ROS, former smoker,    Pulmonary exam normal breath sounds clear to auscultation       Cardiovascular negative cardio ROS Normal cardiovascular exam Rhythm:Regular Rate:Normal     Neuro/Psych PSYCHIATRIC DISORDERS Anxiety Depression negative neurological ROS     GI/Hepatic negative GI ROS, Neg liver ROS,   Endo/Other  negative endocrine ROS  Renal/GU negative Renal ROS  negative genitourinary   Musculoskeletal negative musculoskeletal ROS (+)   Abdominal   Peds negative pediatric ROS (+)  Hematology negative hematology ROS (+)   Anesthesia Other Findings   Reproductive/Obstetrics (+) Pregnancy                             Anesthesia Physical Anesthesia Plan  ASA: 2  Anesthesia Plan: Epidural   Post-op Pain Management:    Induction:   PONV Risk Score and Plan: 2 and Treatment may vary due to age or medical condition  Airway Management Planned: Natural Airway  Additional Equipment:   Intra-op Plan:   Post-operative Plan:   Informed Consent: I have reviewed the patients History and Physical, chart, labs and discussed the procedure including the risks, benefits and alternatives for the proposed anesthesia with the patient or authorized representative who has indicated his/her understanding and acceptance.       Plan Discussed with: Anesthesiologist  Anesthesia Plan Comments:        Anesthesia Quick Evaluation

## 2020-09-08 NOTE — MAU Note (Signed)
Marissa Wright is a 25 y.o. at 65w0dhere in MAU reporting: contractions started this AM around 0100 and they are 5-7 minutes. States she has had some pink bleeding when she wipes. No LOF. +FM  Onset of complaint: today  Pain score: 7/10  Vitals:   09/08/20 0726  BP: 119/72  Pulse: 91  Resp: 18  Temp: 98 F (36.7 C)  SpO2: 97%     FHT:125  Lab orders placed from triage: none

## 2020-09-08 NOTE — Progress Notes (Signed)
S: Ms. Marissa Wright is a 24 y.o. G1P0 at [redacted]w[redacted]d who presents to MAU today complaining contractions q 5 minutes since early AM. She denies vaginal bleeding. She denies LOF. She reports normal fetal movement.    O: BP 129/82   Pulse 92   Temp 98 F (36.7 C) (Oral)   Resp 18   Ht '5\' 3"'$  (1.6 m)   Wt 71.2 kg   LMP 12/01/2019 (Exact Date)   SpO2 97% Comment: room air  BMI 27.81 kg/m  GENERAL: Well-developed, well-nourished female in no acute distress.  HEAD: Normocephalic, atraumatic.  CHEST: Normal effort of breathing, regular heart rate ABDOMEN: Soft, nontender, gravid Seen by MAU RN  Cervical exam:  Dilation: 2 Effacement (%): 70 Cervical Position: Posterior Station: -2 Presentation: Vertex Exam by:: KLysle Morales  Fetal Monitoring: Baseline: 125 Variability: mod Accelerations: present Decelerations: absent Contractions: q5-662m A: SIUP at 3957w0dalse labor  P: Patient feeling better after phenergan and morphine. Will send home with some phenergan. Given return precautions.  MahGladys DammeD 09/08/2020 11:05 AM

## 2020-09-08 NOTE — Anesthesia Procedure Notes (Signed)
Epidural Patient location during procedure: OB Start time: 09/08/2020 5:30 PM End time: 09/08/2020 5:45 PM  Staffing Anesthesiologist: Merlinda Frederick, MD Performed: anesthesiologist   Preanesthetic Checklist Completed: patient identified, IV checked, site marked, risks and benefits discussed, monitors and equipment checked, pre-op evaluation and timeout performed  Epidural Patient position: sitting Prep: DuraPrep Patient monitoring: heart rate, cardiac monitor, continuous pulse ox and blood pressure Approach: midline Location: L3-L4 Injection technique: LOR saline  Needle:  Needle type: Tuohy  Needle gauge: 17 G Needle length: 9 cm Needle insertion depth: 4 cm Catheter type: closed end flexible Catheter size: 20 Guage Catheter at skin depth: 9 cm Test dose: negative and Other  Assessment Events: blood not aspirated, injection not painful, no injection resistance and negative IV test  Additional Notes Informed consent obtained prior to proceeding including risk of failure, 1% risk of PDPH, risk of minor discomfort and bruising.  Discussed rare but serious complications including epidural abscess, permanent nerve injury, epidural hematoma.  Discussed alternatives to epidural analgesia and patient desires to proceed.  Timeout performed pre-procedure verifying patient name, procedure, and platelet count.  Patient tolerated procedure well.

## 2020-09-08 NOTE — H&P (Addendum)
OBSTETRIC ADMISSION HISTORY AND PHYSICAL  Marissa Wright is a 25 y.o. female G1P0 with IUP at 5w0dby early [place] for SOL. She reports +FMs, no LOF, no VB, no blurry vision, headaches, peripheral edema, or RUQ pain.  She plans on breast feeding. She requests OCPs for birth control.   She received her prenatal care at [facility]   Dating: By 6wk [place]--->  Estimated Date of Delivery: 09/15/20  Sono:   '@[redacted]w[redacted]d'$ , CWD, normal anatomy, cephalic presentation, posterior placental lie, 295g, 57% EFW  Prenatal History/Complications:  GBS positive: NKDA ASCUS + HPV: will need repeat cytology 01/14/2021 Rh neg: rhogam given 28 weeks  Past Medical History: Past Medical History:  Diagnosis Date   Depression, major, single episode, mild [place]    Past Surgical History: Past Surgical History:  Procedure Laterality Date   FOOT SURGERY      Obstetrical History: OB History     Gravida  1   Para      Term      Preterm      AB      Living         SAB      IAB      Ectopic      Multiple      Live Births              Social History Social History   Socioeconomic History   Marital status: Single    Spouse name: Not on file   Number of children: Not on file   Years of education: Not on file   Highest education level: Not on file  Occupational History   Not on file  Tobacco Use   Smoking status: Former    Types: Cigarettes   Smokeless tobacco: Never  Vaping Use   Vaping Use: Never used  Substance and Sexual Activity   Alcohol use: No   Drug use: No   Sexual activity: Not Currently  Other Topics Concern   Not on file  Social History Narrative   Not on file   Social Determinants of Health   Financial Resource Strain: Not on file  Food Insecurity: Not on file  Transportation Needs: Not on file  Physical Activity: Not on file  Stress: Not on file  Social Connections: Not on file    Family History: Family History  Problem Relation Age of Onset    Asthma Maternal Grandmother     Allergies: No Known Allergies  Medications Prior to Admission  Medication Sig Dispense Refill Last Dose   Prenatal Vit-Fe Fum-FA-Omega (PRENATAL MULTI +DHA) 27-0.8-228 MG CAPS CHEW 1 EACH BY MOUTH DAILY. 90 capsule 3    promethazine (PHENERGAN) 25 MG tablet Take 1 tablet (25 mg total) by mouth every 6 (six) hours as needed for nausea or vomiting. 5 tablet 0      Review of Systems  All systems reviewed and negative except as stated in HPI  Blood pressure 106/66, pulse 77, temperature 99 F (37.2 C), temperature source Oral, resp. rate 16, height '5\' 3"'$  (1.6 m), weight 71.2 kg, last menstrual period 12/01/2019.  General appearance: alert, cooperative, and appears stated age Lungs: normal work of breathing on room air  Heart: normal rate, warm and well perfused  Abdomen: soft, non-tender, gravid  Extremities: no LE edema or calf tenderness to palpation   Presentation: cephalic Fetal monitoring: Baseline 140 bpm, moderate variability, no accels, variable/late decels  Uterine activity: Every 4-5 minutes  Dilation: 5 Effacement (%):  [place]: -1 Exam by:: s grindstaff rn  Prenatal labs: ABO, Rh: --/--/A NEG (08/29 23) Antibody: POS (08/29 1632) Rubella: 2.04 (01/04 1455) RPR: Non Reactive (06/23 1027)  HBsAg: Negative (01/04 1455)  HIV: Non Reactive (06/23 1027)  GBS: Positive/-- (08/09 1251)  1 hr Glucola 114  Genetic screening Quad and FTS WNL Anatomy US WNL  Prenatal Transfer Tool  Maternal Diabetes: No Genetic Screening: Normal Maternal Ultrasounds/Referrals: Normal Fetal Ultrasounds or other Referrals:  None Maternal Substance Abuse:  No Significant Maternal Medications:  None Significant Maternal Lab Results: Group B Strep positive  Results for orders placed or performed during the hospital encounter of 09/08/20 (from the past 24 hour(s))  CBC   Collection Time: 09/08/20  4:31 PM  Result Value Ref Range   WBC 12.0 (H)  4.0 - 10.5 K/uL   RBC 4.50 3.87 - 5.11 MIL/uL   Hemoglobin 13.0 12.0 - 15.0 g/dL   HCT 38.9 36.0 - 46.0 %   MCV 86.4 80.0 - 100.0 fL   MCH 28.9 26.0 - 34.0 pg   MCHC 33.4 30.0 - 36.0 g/dL   RDW 13.4 11.5 - 15.5 %   Platelets 287 150 - 400 K/uL   nRBC 0.0 0.0 - 0.2 %  Type and screen Kay   Collection Time: 09/08/20  4:32 PM  Result Value Ref Range   ABO/RH(D) A NEG    Antibody Screen POS    Sample Expiration 09/11/2020,2359    Antibody Identification      PASSIVELY ACQUIRED ANTI-D Performed at [facility], [address] [place], [place] [address]   Resp Panel by RT-PCR (Flu A&B, Covid) Nasopharyngeal Swab   Collection Time: 09/08/20  4:38 PM   Specimen: Nasopharyngeal Swab; Nasopharyngeal(NP) swabs in vial transport medium  Result Value Ref Range   SARS Coronavirus 2 by RT PCR NEGATIVE NEGATIVE   Influenza A by PCR NEGATIVE NEGATIVE   Influenza B by PCR NEGATIVE NEGATIVE    Patient Active Problem List   Diagnosis Date Noted   Labor and delivery, indication for care 09/08/2020   Group B streptococcal carriage complicating pregnancy [id number]   Rh negative state in antepartum period, third trimester 04/20/2020   Abnormal Pap smear of cervix 04/20/2020   Encounter for supervision of normal pregnancy in third trimester 01/16/2020   Vaginal wall cyst 11/25/2019   Anxiety 08/20/2016    Assessment/Plan:  Marissa Wright is a 25 y.o. G1P0 at 43w0dhere for SOL.  #Labor: SOL, continue expectant mgmt. Can add pitocin if ctx pattern spaces out. #Pain: Epidural in place  #FWB: Cat II due to occasional variable and late decels. Reassuring variability. Will continue to monitor closely. #ID:  GBS pos. PCN ordered for ppx. #MOF: Breast #MOC: OCPs #Circ:  N/A  #Rh neg: Will obtain infant testing and tx with Rhogam prior to discharge if appropriate.  #ASCUS +HPV: Repeat pap smear pp in January 2023.   GME ATTESTATION:  I saw and evaluated the  patient. I agree with the findings and the plan of care as documented in the resident's note. I have made changes to documentation as necessary.   CVilma Meckel MD OB Fellow, [place]8/29/2022 7:01 PM

## 2020-09-08 NOTE — MAU Note (Signed)
Pt heae earlier for labor  was 1.5cm given MS and sent home. Pt back ctx worse. Had not felt baby move for awhile. Still vomiting a with ctx.

## 2020-09-09 ENCOUNTER — Encounter (HOSPITAL_COMMUNITY): Payer: Self-pay | Admitting: Family Medicine

## 2020-09-09 DIAGNOSIS — Z3A39 39 weeks gestation of pregnancy: Secondary | ICD-10-CM

## 2020-09-09 DIAGNOSIS — O99820 Streptococcus B carrier state complicating pregnancy: Secondary | ICD-10-CM

## 2020-09-09 LAB — CBC
HCT: 32.5 % — ABNORMAL LOW (ref 36.0–46.0)
Hemoglobin: 10.6 g/dL — ABNORMAL LOW (ref 12.0–15.0)
MCH: 28.3 pg (ref 26.0–34.0)
MCHC: 32.6 g/dL (ref 30.0–36.0)
MCV: 86.9 fL (ref 80.0–100.0)
Platelets: 281 10*3/uL (ref 150–400)
RBC: 3.74 MIL/uL — ABNORMAL LOW (ref 3.87–5.11)
RDW: 13.7 % (ref 11.5–15.5)
WBC: 15 10*3/uL — ABNORMAL HIGH (ref 4.0–10.5)
nRBC: 0 % (ref 0.0–0.2)

## 2020-09-09 LAB — SYPHILIS: RPR W/REFLEX TO RPR TITER AND TREPONEMAL ANTIBODIES, TRADITIONAL SCREENING AND DIAGNOSIS ALGORITHM: RPR Ser Ql: NONREACTIVE

## 2020-09-09 MED ORDER — ONDANSETRON HCL 4 MG PO TABS: 4.0000 mg | ORAL_TABLET | ORAL | Status: DC | PRN

## 2020-09-09 MED ORDER — SENNOSIDES-DOCUSATE SODIUM 8.6-50 MG PO TABS
2.0000 | ORAL_TABLET | Freq: Every day | ORAL | Status: DC
  Administered 2020-09-10 – 2020-09-11 (×2): 2 via ORAL
  Filled 2020-09-09 (×2): qty 2

## 2020-09-09 MED ORDER — ZOLPIDEM TARTRATE 5 MG PO TABS: 5.0000 mg | ORAL_TABLET | Freq: Every evening | ORAL | Status: DC | PRN

## 2020-09-09 MED ORDER — COCONUT OIL OIL: 1.0000 "application " | TOPICAL_OIL | Status: DC | PRN

## 2020-09-09 MED ORDER — TETANUS-DIPHTH-ACELL PERTUSSIS 5-2.5-18.5 LF-MCG/0.5 IM SUSY: 0.5000 mL | PREFILLED_SYRINGE | Freq: Once | INTRAMUSCULAR | Status: DC

## 2020-09-09 MED ORDER — PRENATAL MULTIVITAMIN CH
1.0000 | ORAL_TABLET | Freq: Every day | ORAL | Status: DC
  Administered 2020-09-09 – 2020-09-11 (×3): 1 via ORAL
  Filled 2020-09-09 (×3): qty 1

## 2020-09-09 MED ORDER — OXYCODONE HCL 5 MG PO TABS
5.0000 mg | ORAL_TABLET | Freq: Four times a day (QID) | ORAL | Status: DC | PRN
  Administered 2020-09-09: 5 mg via ORAL
  Filled 2020-09-09: qty 1

## 2020-09-09 MED ORDER — IBUPROFEN 600 MG PO TABS
600.0000 mg | ORAL_TABLET | Freq: Four times a day (QID) | ORAL | Status: DC
  Administered 2020-09-09 – 2020-09-11 (×9): 600 mg via ORAL
  Filled 2020-09-09 (×11): qty 1

## 2020-09-09 MED ORDER — DIBUCAINE (PERIANAL) 1 % EX OINT: 1.0000 "application " | TOPICAL_OINTMENT | CUTANEOUS | Status: DC | PRN

## 2020-09-09 MED ORDER — ONDANSETRON HCL 4 MG/2ML IJ SOLN: 4.0000 mg | INTRAMUSCULAR | Status: DC | PRN

## 2020-09-09 MED ORDER — BENZOCAINE-MENTHOL 20-0.5 % EX AERO
1.0000 "application " | INHALATION_SPRAY | CUTANEOUS | Status: DC | PRN
  Administered 2020-09-09: 1 via TOPICAL
  Filled 2020-09-09: qty 56

## 2020-09-09 MED ORDER — ACETAMINOPHEN 500 MG PO TABS: 1000.0000 mg | ORAL_TABLET | Freq: Four times a day (QID) | ORAL | Status: DC | PRN

## 2020-09-09 MED ORDER — WITCH HAZEL-GLYCERIN EX PADS: 1.0000 "application " | MEDICATED_PAD | CUTANEOUS | Status: DC | PRN

## 2020-09-09 MED ORDER — SIMETHICONE 80 MG PO CHEW: 80.0000 mg | CHEWABLE_TABLET | ORAL | Status: DC | PRN

## 2020-09-09 MED ORDER — ACETAMINOPHEN 325 MG PO TABS
650.0000 mg | ORAL_TABLET | ORAL | Status: DC | PRN
  Administered 2020-09-09: 650 mg via ORAL
  Filled 2020-09-09: qty 2

## 2020-09-09 MED ORDER — DIPHENHYDRAMINE HCL 25 MG PO CAPS: 25.0000 mg | ORAL_CAPSULE | Freq: Four times a day (QID) | ORAL | Status: DC | PRN

## 2020-09-09 NOTE — Discharge Summary (Addendum)
Postpartum Discharge Summary   Patient Name: Marissa Wright DOB: 06-08-1995 MRN: [id number]  Date of admission: 09/08/2020 Delivery date:09/09/2020  Delivering provider: Patriciaann Clan  Date of discharge: 09/11/2020  Admitting diagnosis: Labor and delivery, indication for care [O75.9] Intrauterine pregnancy: [redacted]w[redacted]d    Secondary diagnosis:  Principal Problem:   Vacuum-assisted vaginal delivery Active Problems:   Rh negative state in antepartum period, third trimester   Group B streptococcal carriage complicating pregnancy  Additional problems: None    Discharge diagnosis: Term Pregnancy Delivered                                              Post partum procedures: rhogam Augmentation: N/A Complications: None  Hospital course: Onset of Labor With Vaginal Delivery      25y.o. yo G1P0 at 319w1das admitted in Active Labor on 09/08/2020. Patient had an uncomplicated labor course as follows:  Membrane Rupture Time/Date: 12:33 AM ,09/09/2020   Delivery Method:Vaginal, Vacuum (Extractor)  Episiotomy: None  Lacerations:  2nd degree;Labial  Patient had an uncomplicated postpartum course.  She is ambulating, tolerating a regular diet, passing flatus, and urinating well. Patient is discharged home in stable condition on 09/11/20.  Newborn Data: Birth date:09/09/2020  Birth time:1:19 AM  Gender:Female  Living status:Living  Apgars:7 ,9  Weight:3229 g   Magnesium Sulfate received: No BMZ received: No Rhophylac: Yes  MMR: No T-DaP: Given prenatally Flu: N/A Transfusion: No  Physical exam  Vitals:   09/10/20 0520 09/10/20 1423 09/10/20 2208 09/11/20 0538  BP: 105/64 103/68 114/73 103/61  Pulse: 79 86 80 61  Resp: _0 Temp: 98.1 F (36.7 C) 98.6 F (37 C) 98.7 F (37.1 C) 98 F (36.7 C)  TempSrc: Oral Oral Oral Oral  SpO2: 97% 98%  98%  Weight:      Height:       General: alert, cooperative, and no distress Lochia: appropriate Uterine Fundus:  firm Incision: N/A DVT Evaluation:  No evidence of DVT seen on physical exam. Negative Homan's sign. No cords or calf tenderness. No significant calf/ankle edema.  Labs: Lab Results  Component Value Date   WBC 15.0 (H) 09/09/2020   HGB 10.6 (L) 09/09/2020   HCT 32.5 (L) 09/09/2020   MCV 86.9 09/09/2020   PLT 281 09/09/2020   CMP Latest Ref Rng & Units 10/11/2016  Glucose 65 - 99 mg/dL 90  BUN 6 - 20 mg/dL 16  Creatinine 0.57 - 1.00 mg/dL 0.62  Sodium 134 - 144 mmol/L 141  Potassium 3.5 - 5.2 mmol/L 3.8  Chloride 96 - 106 mmol/L 103  CO2 20 - 29 mmol/L 25  Calcium 8.7 - 10.2 mg/dL 9.2  Total Protein 6.0 - 8.5 g/dL 7.3  Total Bilirubin 0.0 - 1.2 mg/dL 0.4  Alkaline Phos 43 - 101 IU/L 63  AST 0 - 40 IU/L 13  ALT 0 - 32 IU/L 12   Edinburgh Score: Edinburgh Postnatal Depression Scale Screening Tool 09/09/2020  I have been able to laugh and see the funny side of things. 0  I have looked forward with enjoyment to things. 0  I have blamed myself unnecessarily when things went wrong. 1  I have been anxious or worried for no good reason. 1  I have felt scared or panicky for no good reason. 0  Things have been  getting on top of me. 0  I have been so unhappy that I have had difficulty sleeping. 0  I have felt sad or miserable. 0  I have been so unhappy that I have been crying. 0  The thought of harming myself has occurred to me. 0  Edinburgh Postnatal Depression Scale Total 2     After visit meds:  Allergies as of 09/11/2020   No Known Allergies      Medication List     STOP taking these medications    promethazine 25 MG tablet Commonly known as: PHENERGAN       TAKE these medications    acetaminophen 500 MG tablet Commonly known as: TYLENOL Take 2 tablets (1,000 mg total) by mouth every 6 (six) hours as needed (for pain scale < 4).   ibuprofen 600 MG tablet Commonly known as: ADVIL Take 1 tablet (600 mg total) by mouth every 6 (six) hours.   Prenatal Multi  +DHA 27-0.8-228 MG Caps CHEW 1 EACH BY MOUTH DAILY.   Slynd 4 MG Tabs Generic drug: Drospirenone Take 1 tablet by mouth daily.         Discharge home in stable condition Infant Feeding: Breast  Infant Disposition: home with mother Discharge instruction: per After Visit Summary and Postpartum booklet. Activity: Advance as tolerated. Pelvic rest for 6 weeks.  Diet: routine diet Future Appointments: Future Appointments  Date Time Provider Department Center  09/16/2020  8:45 AM [facility] US5 [facility]  10/13/2020  2:10 PM Cresenzo, Victor, MD [facility]   Follow up Visit: Message sent to [facility] by Dr Beard on 8/30:   Please schedule this patient for a In person postpartum visit in 4 weeks with the following provider: MD. Additional Postpartum F/U: None   Low risk pregnancy complicated by:  None Delivery mode:  Vaginal, Vacuum (Extractor)  Anticipated Birth Control:  POPs  GME ATTESTATION:  I saw and evaluated the patient. I agree with the findings and the plan of care as documented in the resident's note. I have made changes to documentation as necessary.   , MD OB Fellow, [facility] [place], [facility] 09/11/2020 4:39 PM     

## 2020-09-09 NOTE — Social Work (Addendum)
CLINICAL SOCIAL WORK MATERNAL/CHILD NOTE  Patient Details  Name: Marissa Wright MRN: [id number] Date of Birth: 02/28/1995  Date:  2020/11/01  Clinical Social Worker Initiating Note:  Kathrin Gre[place], [place] Date/Time: Initiated:  09/10/20/1514     Child's Name:  Fausto Skillern   Biological Parents:  Mother   Need for Interpreter:  None   Reason for Referral:  Substance Use During Pregnancy     Address:  [place] [address]    Phone number:  [phone] (home)     Additional phone number:  Household Members/Support Persons (HM/SP):       HM/SP Name Relationship DOB or Age  HM/SP -1        HM/SP -2        HM/SP -3        HM/SP -4        HM/SP -5        HM/SP -6        HM/SP -7        HM/SP -8          Natural Supports (not living in the home):  Parent, Immediate Family   Professional Supports: None   Employment: Full-time   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   Homebound arranged:    Museum/gallery curator Resources:  [employer]   Other Resources:  Physicist, medical  , [place] Considerations Which May Impact Care:   Strengths:  Ability to meet basic needs  , Engineer, materials, Home prepared for child     Psychotropic Medications:         Pediatrician:    [place] area  Pediatrician List:   [place]      Pediatrician Fax Number:    Risk Factors/Current Problems:      Cognitive State:  Able to Concentrate  , Insightful  , Alert  , Linear Thinking     Mood/Affect:  Calm  , Comfortable     CSW Assessment:  CSW received consult for hx of Anxiety, Depression and THC use.  CSW met with MOB to offer support and complete assessment.     CSW met with MOB at bedside. CSW congratulated MOB and introduced CSW role. CSW observed MOB lying in bed and infant sleeping in bassinet. MOB introduced  her supports as her mother and sister. MOB gave CSW permission to complete the assessment and share information with supports present. MOB presented calm and receptive to [place] visit. CSW inquired how MOB has felt since giving birth. MOB expressed feeling good and reported she had a good experience in labor and delivery. CSW inquired about MOB history of depression and anxiety. MOB acknowledged she was diagnosed with depression and anxiety while in high school. MOB shared she took medication in 2018 for her symptoms however she felt the medication did not help. MOB reported no history of therapy treatment. MOB denied any recent concerns with depression and anxiety. CSW inquired how MOB copes with anxiety and depression. MOB disclosed she has felt better since living on her own and implementing coping mechanism when needed such as listening to music. MOB shared she also has great support from her mother and sister. CSW provided education regarding the baby blues period vs. perinatal mood disorders, discussed treatment and gave resources for mental health follow up if  concerns arise.  CSW recommended MOB complete a self-evaluation during the postpartum time period using the New Mom Checklist from Postpartum Progress and encouraged MOB to contact a medical professional if symptoms are noted at any time. MOB reported understanding and was receptive to the resources provided. CSW assessed MOB for safety. MOB denied thoughts of harm to self and others.    CSW provided review of Sudden Infant Death Syndrome (SIDS) precautions.  MOB shared she has essential items for the infant including a car seat and a bassinet where the infant will sleep. MOB has chosen [facility] for infant's follow up care. MOB shared she receives both WIC/FS benefits and has received a follow up call from the [facility] office. CSW assessed MOB for additional resources. MOB reported no further needs.   Update 8/31: Infant's urine resulted positive  for opiates. CSW informed MOB and confirmed MOB received morphine while admitted to MAU. MOB informed CSW she used THC during the first 3 months of pregnancy. CSW informed MOB about the hospital drug screen policy. MOB reported understanding and denied using any other substance. MOB confirmed the demographic information on hospital file is correct.   -CSW will follow Baby's CDS and make a report to CPS if warranted.    CSW identifies no further need for intervention and no barriers to discharge at this time.  CSW Plan/Description:  CSW Will Continue to Monitor Umbilical Cord Tissue Drug Screen Results and Make Report if Warranted, [place], LCSW 2020/02/25, 3:25 PM

## 2020-09-09 NOTE — Lactation Note (Signed)
This note was copied from a baby's chart. Lactation Consultation Note  Patient Name: Marissa Wright [id number] Date: 09/09/2020 Reason for consult: Difficult latch;1st time breastfeeding;Primapara;Follow-up assessment;Term Age:23 hours  LC in to assist with feeding.  Baby having difficulty latching to the breast and Mom has syringe fed EBM that she has manually pumped twice.    LC assisted with laid back and football holds and baby not cueing much.    Mom has compressible areola and flat nipples that do invert some when breast is sandwiched. NT in to do baby's bath.   After baby's bath-  Initiated a 16 mm nipple shield after pre-pumping.  6 ml EBM instilled into shield and baby fed for about 5 mins with swallowing heard.  Baby wanting to push nipple shield out of her mouth, Mom assisted in supporting her breast firmly. Baby placed STS on Mom's chest while Mom let baby suck on her finger.    DEBP set up and Mom sized with 21 flanges for a good fit. After 1 hr of STS, Mom will double pump on initiation setting, asking her RN for assistance.   Encouraged Mom to support her milk supply with consistent double pumping. Mom has [place] and referral faxed for pump at discharge, with Mom's permission.    LATCH Score Latch: Repeated attempts needed to sustain latch, nipple held in mouth throughout feeding, stimulation needed to elicit sucking reflex.  Audible Swallowing: A few with stimulation (With EBM supplementation at breast)  Type of Nipple: Flat  Comfort (Breast/Nipple): Soft / non-tender  Hold (Positioning): Assistance needed to correctly position infant at breast and maintain latch.  LATCH Score: 6   Lactation Tools Discussed/Used Tools: Nipple Shields;Pump;Flanges Nipple shield size: 16 Flange Size: 21 Breast pump type: Double-Electric Breast Pump Pump Education: Setup, frequency, and cleaning;Milk Storage Reason for Pumping: Support milk supply/difficult latch/nipple  shield Pumping frequency: Encouraged Mom to pump after breastfeeding Pumped volume: 5 mL  Interventions Interventions: Assisted with latch;Skin to skin;Breast massage;Hand express;Pre-pump if needed;Breast compression;Adjust position;Support pillows;Position options;Expressed milk;Hand pump;DEBP;Education  Discharge [place] Program: Yes  Consult Status Consult Status: Follow-up Date: 09/10/20 Follow-up type: In-patient    [name] 09/09/2020, 2:59 PM

## 2020-09-09 NOTE — Anesthesia Postprocedure Evaluation (Signed)
Anesthesia Post Note  Patient: Marissa Wright  Procedure(s) Performed: AN AD [place]     Patient location during evaluation: Mother Baby Anesthesia Type: Epidural Level of consciousness: awake and alert and oriented Pain management: satisfactory to patient Vital Signs Assessment: post-procedure vital signs reviewed and stable Respiratory status: respiratory function stable Cardiovascular status: stable Postop Assessment: no headache, no backache, epidural receding, patient able to bend at knees, no signs of nausea or vomiting and adequate PO intake Anesthetic complications: no   No notable events documented.  Last Vitals:  Vitals:   09/09/20 0350 09/09/20 0450  BP: 111/67 109/63  Pulse: 88 78  Resp: 18 18  Temp: 37.8 C 37.4 C  SpO2: 100% 100%    Last Pain:  Vitals:   09/09/20 0450  TempSrc: Oral  PainSc:    Pain Goal:                   Derren Suydam

## 2020-09-09 NOTE — Lactation Note (Signed)
This note was copied from a baby's chart. Lactation Consultation Note  Patient Name: Marissa Wright [id number] Date: 09/09/2020 Reason for consult: Initial assessment;1st time breastfeeding;Primapara;Term Age:23 hours   P1 mother whose infant is now 74 hours old.  This is a term baby at 39+1 weeks.  Baby swaddled, awake and in the bassinet when I arrived.  Offered to assist with latching and mother interested.  Taught hand expression and mother able to express a few drops.  Mother's breasts are soft and non tender and nipples are flat and invert with hand expression.  Provided a manual pump with instructions for use to pre-pump nipple prior to latching.  Baby calm and quiet.  She latched but "Malani" was not interested in initiating a suck.  Placed her STS on mother's chest where she lay gazing at mother.  Encouraged lots of STS and advised to feed on cue.  Mother will pre-pump prior to latching and call for assistance as needed.  Mother may benefit from a NS if baby does not have an effective latch when feeding readiness begins.    Mom made aware of O/P services, breastfeeding support groups, community resources, and our phone # for post-discharge questions.  Mother has a DEBP for home use.  Grandmother present and assisting with care.     Maternal Data Has patient been taught Hand Expression?: Yes Does the patient have breastfeeding experience prior to this delivery?: No  Feeding Mother's Current Feeding Choice: Breast Milk  LATCH Score Latch: Too sleepy or reluctant, no latch achieved, no sucking elicited.  Audible Swallowing: None  Type of Nipple: Flat  Comfort (Breast/Nipple): Soft / non-tender  Hold (Positioning): Assistance needed to correctly position infant at breast and maintain latch.  LATCH Score: 4   Lactation Tools Discussed/Used    Interventions Interventions: Assisted with latch;Breast feeding basics reviewed;Skin to skin;Breast massage;Hand  express;Pre-pump if needed;Adjust position;Support pillows;Position options;Hand pump;Education  Discharge Pump: Manual;Personal  Consult Status Consult Status: Follow-up Date: 09/10/20 Follow-up type: In-patient    Marissa Wright 09/09/2020, 8:17 AM

## 2020-09-10 ENCOUNTER — Encounter: Payer: 59 | Admitting: Family Medicine

## 2020-09-10 MED ORDER — RHO D IMMUNE GLOBULIN 1500 UNIT/2ML IJ SOSY
300.0000 ug | PREFILLED_SYRINGE | Freq: Once | INTRAMUSCULAR | Status: AC
  Filled 2020-09-10: qty 2

## 2020-09-10 MED ORDER — RHO D IMMUNE GLOBULIN 1500 UNIT/2ML IJ SOSY
300.0000 ug | PREFILLED_SYRINGE | Freq: Once | INTRAMUSCULAR | Status: AC
  Administered 2020-09-10: 300 ug via INTRAVENOUS
  Filled 2020-09-10: qty 2

## 2020-09-10 NOTE — Progress Notes (Signed)
Post Partum Day 1 from Vacuum-assisted delivery Subjective: up ad lib, voiding, tolerating PO, + flatus, and perineal and crampy uterine pain, well controlled with Tylenol and Ibuprofen  Objective: Blood pressure 110/66, pulse 86, temperature 98.2 F (36.8 C), temperature source Oral, resp. rate 17, height '5\' 3"'$  (1.6 m), weight 71.2 kg, last menstrual period 12/01/2019, SpO2 98 %, unknown if currently breastfeeding.  Physical Exam:  General: alert, cooperative, and no distress Lochia: appropriate Uterine Fundus: firm DVT Evaluation: No evidence of DVT seen on physical exam.  Recent Labs    09/08/20 1631 09/09/20 0523  HGB 13.0 10.6*  HCT 38.9 32.5*    Assessment/Plan: Plan for discharge tomorrow, Breastfeeding, and Contraception will discharge with Rx for POPs  #Patient will need Rhogam prior to discharge, order placed   LOS: 2 days   Pearla Dubonnet 09/10/2020, 6:02 AM

## 2020-09-11 LAB — RH IG WORKUP (INCLUDES ABO/RH)
Fetal Screen: NEGATIVE
Gestational Age(Wks): 39.1
Unit division: 0

## 2020-09-11 MED ORDER — ACETAMINOPHEN 500 MG PO TABS: 1000.0000 mg | ORAL_TABLET | Freq: Four times a day (QID) | ORAL | 0 refills | Status: DC | PRN

## 2020-09-11 MED ORDER — SLYND 4 MG PO TABS: 1.0000 | ORAL_TABLET | Freq: Every day | ORAL | 0 refills | Status: DC

## 2020-09-11 MED ORDER — IBUPROFEN 600 MG PO TABS
600.0000 mg | ORAL_TABLET | Freq: Four times a day (QID) | ORAL | 0 refills | Status: DC
Start: 2020-09-11 — End: 2022-09-17

## 2020-09-16 ENCOUNTER — Ambulatory Visit: Payer: Medicaid Other

## 2020-09-23 ENCOUNTER — Telehealth (HOSPITAL_COMMUNITY): Payer: Self-pay

## 2020-09-23 NOTE — Telephone Encounter (Signed)
"  I'm doing good. Everything is a lot better now." Patient declines questions or concerns about her healing.  "She is doing great. Peeing and pooping well. Everything is really good. She sleeps in her bassinet." RN reviewed ABC's of safe sleep with patient. Patient declines any questions or concerns about baby.  EPDS score is 0.  Jerald Kief 09/23/2020,

## 2020-10-13 ENCOUNTER — Other Ambulatory Visit: Payer: Self-pay

## 2020-10-13 ENCOUNTER — Ambulatory Visit (INDEPENDENT_AMBULATORY_CARE_PROVIDER_SITE_OTHER): Payer: 59 | Admitting: Family Medicine

## 2020-10-13 NOTE — Progress Notes (Signed)
  [place] Postpartum Visit   Marissa Wright is a 25 y.o. G1P1001 presenting for a postpartum visit.  She has the following concerns today: None She delivered via SVD at 39 weeks 1 day.  She reports her vaginal bleeding is gone . She is breast  feeding her infant. She feels she is bonding well. She is considering OCPs for contraception.   Edinburgh Postnatal Depression Scale: 4 (10 or higher is positive)  Reviewed pregnancy and delivery course .  -  Vitals:   10/13/20 1412  BP: (!) 96/56  Pulse: 79   Exam: General: Well-appearing, no acute distress Pelvic exam: Well healing vaginal tears, normal vaginal tissue, no concerns  A/P:  Postpartum visit: patient is 5 weeks postpartum following a vaginal delivery. -Discussed patients delivery and complications -Patient had a second degree laceration, perineal healing reviewed. Patient expressed understanding -Patient has urinary incontinence? No -Patient is safe to resume physical and sexual activity -Patient does not want a pregnancy in the next year.  Desired family size is 2 children.  -Reviewed forms of contraception in tiered fashion. Patient desired oral contraceptives (estrogen/progesterone), oral progesterone-only contraceptive after she finishes breast feeding -Return to sexual activity and contraception discussed as above.  -Discussed birth spacing of 18 months -Breastfeeding: Yes, provided support of decision and resources as indicated  -Mood: Happy   -Discussed sleep and fatigue management and encouraged family/community support.  -Postpartum vaccines: None  -Need for postpartum diabetes screening:  Not indicated  -Reviewed prior Pap and she is next due for Pap 01/14/2021.  -Return in No follow-ups on file.

## 2020-10-13 NOTE — Patient Instructions (Signed)
It was great seeing you today!  I am glad you are doing so well and I have no concerns at this time.  Your tears are healing well.  Please let me know if you want any birth control in the near future.  You have any questions or concerns call the clinic.  I hope you have a great afternoon!

## 2021-01-25 NOTE — Progress Notes (Signed)
° ° °  SUBJECTIVE:   CHIEF COMPLAINT / HPI:   Postpartum depression: 26 year old female presenting for postpartum depression. Patient gave birth on 09/09/2020.  She states it has been improving lately but started about a month ago when she was having to return to work. Works at [employer], [place] shifts. Was previously on Lamictal in the past for mood stabilizer.  She is potentially interested in some counseling at this time.  PERTINENT  PMH / PSH: Anxiety  OBJECTIVE:   BP 114/68    Pulse 67    Ht 5\' 3"  (1.6 m)    Wt 110 lb 12.8 oz (50.3 kg)    LMP 01/25/2020    SpO2 98%    Breastfeeding Yes    BMI 19.63 kg/m    [facility] Visit from 01/28/2021 in [place]  PHQ-9 Total Score 6        General: NAD, pleasant, able to participate in exam Respiratory: No respiratory distress Skin: warm and dry, no rashes noted Psych: Normal affect and mood.  ASSESSMENT/PLAN:   Postpartum depression PHQ-9 score of 6 today.  No SI/HI.  No concerns for baby safety.  Patient was previously on a mood stabilizer, Lamictal in the past.  Will avoid SSRIs due to concern for possible history of bipolar.  Did discuss with our psychologist Dr. Oren Binet as the patient was interested in doing some counseling.  They have scheduled a follow-up visit for this.  If patient determines that she would like to start a medication will refer for psychiatry.   Lurline Del, [place]

## 2021-01-28 ENCOUNTER — Ambulatory Visit (INDEPENDENT_AMBULATORY_CARE_PROVIDER_SITE_OTHER): Payer: Medicaid Other | Admitting: Family Medicine

## 2021-01-28 ENCOUNTER — Other Ambulatory Visit: Payer: Self-pay

## 2021-01-28 ENCOUNTER — Encounter: Payer: Self-pay | Admitting: Family Medicine

## 2021-01-28 VITALS — BP 114/68 | HR 67 | Ht 63.0 in | Wt 110.8 lb

## 2021-01-28 DIAGNOSIS — F53 Postpartum depression: Secondary | ICD-10-CM

## 2021-01-28 NOTE — Assessment & Plan Note (Addendum)
PHQ-9 score of 6 today.  No SI/HI.  No concerns for baby safety.  Patient was previously on a mood stabilizer, Lamictal in the past.  Will avoid SSRIs due to concern for possible history of bipolar.  Did discuss with our psychologist Dr. Oren Binet as the patient was interested in doing some counseling.  They have scheduled a follow-up visit for this.  If patient determines that she would like to start a medication will refer for psychiatry.  She will follow-up with me in about 1 month to see how she is doing.

## 2021-01-28 NOTE — Patient Instructions (Signed)
For your concern for postpartum depression we have started a counseling session set up with Dr. Oren Binet.  I looked back at the previous medication and because of the type of medication it is if we determined that medications are needed I will likely reach for you for psychiatry to make sure is managed appropriately.  If develop any thoughts of harming yourself or harming anyone else please reach out for help.  I would like to see you back in 1 to 2 months to make sure things are continuing to improve.

## 2021-02-04 ENCOUNTER — Telehealth: Payer: Self-pay | Admitting: Psychology

## 2021-02-04 ENCOUNTER — Ambulatory Visit: Payer: Medicaid Other | Admitting: Psychology

## 2021-02-04 NOTE — Telephone Encounter (Signed)
Spoke with pt about needing to refer her out due to conflict of seeing another family member.  Resource sent to her.   Erlinda Hong, PhD., LMFT

## 2021-03-19 NOTE — Progress Notes (Deleted)
? ? ?  SUBJECTIVE:  ? ?CHIEF COMPLAINT / HPI:  ? ?*** ?Encounter for PAP: ?Last pap was 1 year ago and resulted with ASC-US with recommended repeat in 1 year. ? ?PERTINENT  PMH / PSH: *** ? ?OBJECTIVE:  ? ?There were no vitals taken for this visit. *** ? ?General: NAD, pleasant, able to participate in exam ?Cardiac: RRR, no murmurs. ?Respiratory: CTAB, normal effort, No wheezes, rales or rhonchi ?Abdomen: Bowel sounds present, nontender, nondistended, no hepatosplenomegaly. ?Pelvic exam: {pelvic exam:315900}. Chaperone: *** ?Neuro: alert, no obvious focal deficits ?Psych: Normal affect and mood ? ?ASSESSMENT/PLAN:  ? ?No problem-specific Assessment & Plan notes found for this encounter. ?  ? ? ?Lurline Del, DO ?[place]  ? ? ?{    This will disappear when note is signed, click to select method of visit    :1} ?

## 2021-03-20 ENCOUNTER — Ambulatory Visit: Payer: Medicaid Other | Admitting: Family Medicine

## 2021-03-24 NOTE — Progress Notes (Signed)
? ? ?  SUBJECTIVE:  ? ?CHIEF COMPLAINT / HPI:  ? ?Encounter for PAP: ?Last pap was 1 year ago and resulted with ASC-US with recommended repeat in 1 year. ? ?Vaginal discharge: ?Has been present for a few days.  Patient is not interested in STI screening.  She thinks that she may have BV or yeast infection. ? ?PERTINENT  PMH / PSH: None relevant ? ?OBJECTIVE:  ? ?BP 110/80   Pulse 63   Ht '5\' 3"'$  (1.6 m)   Wt 105 lb 6.4 oz (47.8 kg)   LMP 03/22/2021   SpO2 99%   BMI 18.67 kg/m?   ? ?General: NAD, pleasant, able to participate in exam ?Respiratory: No respiratory stress ?Pelvic exam: VULVA: normal appearing vulva with no masses, tenderness or lesions, VAGINA: vaginal discharge - white, copious, and creamy, CERVIX: normal appearing cervix without discharge or lesions. Chaperone: Alexis ?Neuro: alert, no obvious focal deficits ?Psych: Normal affect and mood ? ?ASSESSMENT/PLAN:  ? ?Encounter for Pap smear: ?Pap smear performed today.  We will follow-up with patient with her results. ? ?Vaginal discharge: ?2 to 3 days of discharge, on physical exam she has a thick whitish discharge suggestive of yeast infection.  Wet prep form showed no yeast or clue cells.  Patient not interested in STI testing.  We will treat for presumed yeast infection with Diflucan.. ? ?Lurline Del, DO ?[place]  ? ? ? ?

## 2021-03-25 ENCOUNTER — Other Ambulatory Visit (HOSPITAL_COMMUNITY)
Admission: RE | Admit: 2021-03-25 | Discharge: 2021-03-25 | Disposition: A | Discharge status: Home / Self Care | Payer: Medicaid Other | Source: Ambulatory Visit | Attending: Family Medicine | Admitting: Family Medicine

## 2021-03-25 ENCOUNTER — Other Ambulatory Visit: Payer: Self-pay

## 2021-03-25 ENCOUNTER — Ambulatory Visit (INDEPENDENT_AMBULATORY_CARE_PROVIDER_SITE_OTHER): Payer: Medicaid Other | Admitting: Family Medicine

## 2021-03-25 ENCOUNTER — Encounter: Payer: Self-pay | Admitting: Family Medicine

## 2021-03-25 VITALS — BP 110/80 | HR 63 | Ht 63.0 in | Wt 105.4 lb

## 2021-03-25 DIAGNOSIS — N898 Other specified noninflammatory disorders of vagina: Secondary | ICD-10-CM | POA: Diagnosis present

## 2021-03-25 DIAGNOSIS — Z124 Encounter for screening for malignant neoplasm of cervix: Secondary | ICD-10-CM | POA: Insufficient documentation

## 2021-03-25 LAB — POCT WET PREP (WET MOUNT)
Clue Cells Wet Prep Whiff POC: NEGATIVE
Trichomonas Wet Prep HPF POC: ABSENT

## 2021-03-25 MED ORDER — FLUCONAZOLE 150 MG PO TABS: 150.0000 mg | ORAL_TABLET | Freq: Once | ORAL | 0 refills | Status: AC

## 2021-03-25 NOTE — Patient Instructions (Signed)
We performed your Pap smear today and I will let you know the results when it returns.  We also performed a wet prep that did not show any obvious yeast or BV infection, however your symptoms do match a yeast infection so have sent in a medicine for this.  If you have any further questions or concerns please let us know. ?

## 2021-03-30 LAB — CYTOLOGY - PAP
Adequacy: ABSENT
Diagnosis: NEGATIVE
Diagnosis: REACTIVE

## 2021-04-08 ENCOUNTER — Other Ambulatory Visit (HOSPITAL_COMMUNITY)
Admission: RE | Admit: 2021-04-08 | Discharge: 2021-04-08 | Disposition: A | Discharge status: Home / Self Care | Payer: Medicaid Other | Source: Ambulatory Visit | Attending: Family Medicine | Admitting: Family Medicine

## 2021-04-08 ENCOUNTER — Encounter: Payer: Self-pay | Admitting: Family Medicine

## 2021-04-08 ENCOUNTER — Ambulatory Visit (INDEPENDENT_AMBULATORY_CARE_PROVIDER_SITE_OTHER): Payer: Medicaid Other | Admitting: Family Medicine

## 2021-04-08 VITALS — BP 110/65 | HR 86 | Ht 63.0 in | Wt 104.2 lb

## 2021-04-08 DIAGNOSIS — B851 Pediculosis due to Pediculus humanus corporis: Secondary | ICD-10-CM | POA: Insufficient documentation

## 2021-04-08 DIAGNOSIS — N898 Other specified noninflammatory disorders of vagina: Secondary | ICD-10-CM | POA: Diagnosis present

## 2021-04-08 DIAGNOSIS — A599 Trichomoniasis, unspecified: Secondary | ICD-10-CM | POA: Diagnosis not present

## 2021-04-08 LAB — POCT WET PREP (WET MOUNT)
Clue Cells Wet Prep Whiff POC: NEGATIVE
WBC, Wet Prep HPF POC: >20

## 2021-04-08 MED ORDER — METRONIDAZOLE 500 MG PO TABS: 2000.0000 mg | ORAL_TABLET | Freq: Once | ORAL | 0 refills | Status: AC

## 2021-04-08 NOTE — Patient Instructions (Signed)
I am prescribing metronidazole for you to take 4 tablets which is 2000 mg 1 time.  You should pump and use pumped breast milk for 12 to 24 hours after taking this and then you can resume breast-feeding.  If your symptoms do not get better please return but this should take care of your symptoms.  I will let you know the results of your other testing when it is completed. ?

## 2021-04-08 NOTE — Progress Notes (Signed)
? ? ?  SUBJECTIVE:  ? ?CHIEF COMPLAINT / HPI:  ? ?Vaginal Discharge: ?Patient is a 26 y.o. female presenting with vaginal discharge for several days.  I previously saw her about a week ago with similar symptoms and had negative wet prep.  However, she states that on Sunday she started developing a yellowish discharge which was different and yesterday started develop some itching.  She has some concern she may have BV.  Additionally she would like to screen for chlamydia and gonorrhea. ? ?PERTINENT  PMH / PSH: None relevant ? ?OBJECTIVE:  ? ?LMP 03/22/2021   ? ?General: NAD, pleasant, able to participate in exam ?Respiratory: Normal effort, no obvious respiratory distress ?Pelvic: VULVA: normal appearing vulva with no masses, tenderness or lesions, VAGINA: Normal appearing vagina with normal color, no lesions, with copious yellowish discharge present ?Chaperone Nehemiah Settle present for pelvic exam ? ?ASSESSMENT/PLAN:  ?  ?Vaginal discharge: ?26 y.o. female with vaginal discharge several days which recently changed to a yellowish color and she developed some itching over the past few days.  Physical exam significant for frothy discharge and copious quantities.  Wet prep performed today shows trichomonas.  Patient is interested in STI screening for gonorrhea and chlamydia but is not interested in syphilis RPR.   ?Plan: ?-Wet prep as above.  Will treat with metronidazole 2000 mg once due to the fact patient is breast-feeding.  She is going to pump and use that for the next 12 to 24 hours.  She will follow-up if her symptoms do not improve.  Recommended against intercourse until she completes treatment and her partners treated.. ?-GC/chlamydia pending ? ? ?Lurline Del, DO ?[place]  ?

## 2021-04-09 LAB — CERVICOVAGINAL ANCILLARY ONLY
Chlamydia: NEGATIVE
Comment: NEGATIVE
Comment: NORMAL
Neisseria Gonorrhea: NEGATIVE

## 2021-05-26 ENCOUNTER — Ambulatory Visit (INDEPENDENT_AMBULATORY_CARE_PROVIDER_SITE_OTHER): Payer: Medicaid Other | Admitting: Family Medicine

## 2021-05-26 VITALS — BP 100/60 | HR 56 | Temp 98.3°F | Wt 104.4 lb

## 2021-05-26 DIAGNOSIS — Z30019 Encounter for initial prescription of contraceptives, unspecified: Secondary | ICD-10-CM

## 2021-05-26 LAB — POCT URINE PREGNANCY: Preg Test, Ur: POSITIVE — AB

## 2021-05-26 MED ORDER — LEVONORGESTREL 1.5 MG PO TABS: 1.5000 mg | ORAL_TABLET | Freq: Once | ORAL | 0 refills | Status: DC

## 2021-05-26 NOTE — Assessment & Plan Note (Addendum)
Patient presenting to discuss contraceptive management.  Interested in Depo or [place].  Patient reports that menses were extremely painful and she had heavy bleeding prior to becoming pregnant and was on OCPs to help control this.  Is breast-feeding with not like to take anything that would affect her milk supply.  Discussed all the options and patient is interested in getting Depo today.  She is considering an IUD in the future.  She was last sexually active yesterday without protection.  She is only had 3 menses since delivering.  Checking urine pregnancy test today.  Urine pregnancy test today was positive.  Discussed options and patient would like to seek help at [facility] health or [facility].  Provided information on how to contact women's health for abortive needs.  Stressed the importance of doing this as soon as possible.  Told patient that if she does decide to keep the child we can provide her pregnancy care.  Return precautions given. ?

## 2021-05-26 NOTE — Progress Notes (Signed)
? ? ?  SUBJECTIVE:  ? ?CHIEF COMPLAINT / HPI:  ? ?Contraceptive management ?Patient presents to discuss birth control options.  She had a baby approximately 9 months ago but does not wish to get pregnant again at this time.  Has been looking into different birth controls and is interested in either the Nexplanon or Depo but would like to hear about other options.  She reports that since delivering she has been breast-feeding continuously and she wants something that will not affect her milk supply.  Patient reports her LMP was approximately 2 months ago but she really cannot remember.  She reports that since delivering she has only had 3 menses and they have been very irregular. ? ?OBJECTIVE:  ? ?BP 100/60   Pulse (!) 56   Temp 98.3 ?F (36.8 ?C)   Wt 104 lb 6.4 oz (47.4 kg)   SpO2 100%   BMI 18.49 kg/m?   ?General: Pleasant 26 year old female in no acute distress ?Cardiac: Regular rate and rhythm ?Respite: Breathing, speaking in full sentences ?Abdomen: Soft, nontender, uterus palpable at the pubic symphysis ?MSK: No gross abnormalities ? ?ASSESSMENT/PLAN:  ? ?Encounter for initial prescription of contraceptives ?Patient presenting to discuss contraceptive management.  Interested in Depo or [place].  Patient reports that menses were extremely painful and she had heavy bleeding prior to becoming pregnant and was on OCPs to help control this.  Is breast-feeding with not like to take anything that would affect her milk supply.  Discussed all the options and patient is interested in getting Depo today.  She is considering an IUD in the future.  She was last sexually active yesterday without protection.  She is only had 3 menses since delivering.  Checking urine pregnancy test today.  Urine pregnancy test today was positive.  Discussed options and patient would like to seek help at [facility] health or Planned Parenthood.  Provided information on how to contact women's health for abortive needs.  Stressed the  importance of doing this as soon as possible.  Told patient that if she does decide to keep the child we can provide her pregnancy care.  Return precautions given. ?  ? ? ?Gifford Shave, MD ?[place]  ? ?

## 2021-05-26 NOTE — Patient Instructions (Addendum)
It was a pleasure seeing you today.  We collected a urine pregnancy test and you are pregnant.  Below is information on a woman's choice which is abortive care in [place].  If you have any issues, questions, please let us know. ? ?[facility] [place] We are located down a long driveway between [address] and [address]. Turn in and as you drive, you will see our clinic and parking.    ? ?* If you see protestors, please do not stop, continue to drive to [employer]. You may see our clinic escorts in rainbow vests there to help guide you. ? ?[place] ?[place], [place] [address] ? ?[phone] ? ?Office Hours ?Monday - Thursday  8 AM - 5 PM ?Friday  8 AM - 4 PM ?Saturday  8 AM to 12 PM  ?

## 2021-06-11 NOTE — Progress Notes (Signed)
    SUBJECTIVE:   CHIEF COMPLAINT / HPI: concern for BV    26 y.o. female complains of white and malodorous vaginal discharge for >1 week. She states that she did have vaginal pruritis initially but after completing 2 days of a 3-day course of Monistat, the pruritus resolved so she discontinued the treatment.  She states that a few days later, the vaginal pruritus began again so she completed a 3-day course of Monistat.  Following the completion of this treatment course, she notices increased vaginal discharge and decided to be evaluated here. Denies abnormal vaginal bleeding or significant pelvic pain or fever. No UTI symptoms.  Hx notable for trichomonas treated in March 2023. States that she was able to complete treatment. She reports that her partner was also treated.  She declines STI testing today.  Patient notes that she is currently breastfeeding. She also had positive pregnancy test recently and is scheduled to have medical termination 06/15/21.    PERTINENT  PMH / PSH:  Trichomonas treated March 2023  Pregnancy test + 05/26/21  OBJECTIVE:   BP 100/70   Pulse 70   Wt 103 lb 6.4 oz (46.9 kg)   SpO2 100%   BMI 18.32 kg/m   Physical Exam Exam conducted with a chaperone present.  Constitutional:      General: She is not in acute distress.    Appearance: Normal appearance. She is not ill-appearing.  Genitourinary:    Pubic Area: No rash.      Tanner stage (genital): 5.     Labia:        Right: No lesion.        Left: No lesion.      Urethra: No urethral pain, urethral swelling or urethral lesion.     Vagina: Vaginal discharge present. No erythema or bleeding.     Cervix: Discharge present. No erythema.     Comments: Erythema surrounding hair follicles No purulent drainage or ulceration  Shaved pubic hair  Neurological:     Mental Status: She is alert.     ASSESSMENT/PLAN:   Vaginal discharge Suspect bacterial vaginosis or Candida vaginitis. Collected wet prep  today Patient declines STI testing at this time We will treat based on results of wet prep      Eulis Foster, MD [place]

## 2021-06-12 ENCOUNTER — Encounter: Payer: Self-pay | Admitting: Family Medicine

## 2021-06-12 ENCOUNTER — Ambulatory Visit (INDEPENDENT_AMBULATORY_CARE_PROVIDER_SITE_OTHER): Payer: Medicaid Other | Admitting: Family Medicine

## 2021-06-12 VITALS — BP 100/70 | HR 70 | Wt 103.4 lb

## 2021-06-12 DIAGNOSIS — N898 Other specified noninflammatory disorders of vagina: Secondary | ICD-10-CM

## 2021-06-12 LAB — POCT WET PREP (WET MOUNT)
Clue Cells Wet Prep Whiff POC: POSITIVE
Trichomonas Wet Prep HPF POC: ABSENT

## 2021-06-12 MED ORDER — METRONIDAZOLE 0.75 % EX GEL: 1.0000 "application " | Freq: Two times a day (BID) | CUTANEOUS | 0 refills | Status: AC

## 2021-06-12 NOTE — Patient Instructions (Signed)
I will notify you of the results of your wet prep once they are available.  We will treat based on those results.

## 2021-06-12 NOTE — Assessment & Plan Note (Signed)
Suspect bacterial vaginosis or Candida vaginitis. Collected wet prep today Patient declines STI testing at this time We will treat based on results of wet prep

## 2021-06-16 ENCOUNTER — Encounter: Payer: Self-pay | Admitting: *Deleted

## 2021-07-29 ENCOUNTER — Other Ambulatory Visit (HOSPITAL_COMMUNITY)
Admission: RE | Admit: 2021-07-29 | Discharge: 2021-07-29 | Disposition: A | Discharge status: Home / Self Care | Payer: Medicaid Other | Source: Ambulatory Visit | Attending: Family Medicine | Admitting: Family Medicine

## 2021-07-29 ENCOUNTER — Ambulatory Visit (INDEPENDENT_AMBULATORY_CARE_PROVIDER_SITE_OTHER): Payer: Medicaid Other | Admitting: Student

## 2021-07-29 VITALS — BP 101/58 | HR 80 | Ht 63.0 in | Wt 107.0 lb

## 2021-07-29 DIAGNOSIS — N898 Other specified noninflammatory disorders of vagina: Secondary | ICD-10-CM

## 2021-07-29 DIAGNOSIS — Z113 Encounter for screening for infections with a predominantly sexual mode of transmission: Secondary | ICD-10-CM | POA: Diagnosis not present

## 2021-07-29 DIAGNOSIS — Z30013 Encounter for initial prescription of injectable contraceptive: Secondary | ICD-10-CM | POA: Diagnosis not present

## 2021-07-29 DIAGNOSIS — Z3200 Encounter for pregnancy test, result unknown: Secondary | ICD-10-CM

## 2021-07-29 LAB — POCT WET PREP (WET MOUNT)
Clue Cells Wet Prep Whiff POC: POSITIVE
Trichomonas Wet Prep HPF POC: ABSENT

## 2021-07-29 LAB — POCT URINE PREGNANCY: Preg Test, Ur: NEGATIVE

## 2021-07-29 MED ORDER — MEDROXYPROGESTERONE ACETATE 150 MG/ML IM SUSP
150.0000 mg | Freq: Once | INTRAMUSCULAR | Status: AC
  Administered 2021-07-29: 150 mg via INTRAMUSCULAR

## 2021-07-29 NOTE — Progress Notes (Signed)
  SUBJECTIVE:   CHIEF COMPLAINT / HPI:   Vaginal Discharge - Had an abortion over a month ago; no longer bleeding-stopped 1-2 weeks ago  - intermittent stomach pain after delivery of her child last year that is tolerable  - wants to be checked for STIs. Previously tested. - preferred gender of partner: Male  - Sexually active with 1 partner(s) - Last sexual encounter: 1.5 month ago  - Contraception: No   Wants to start Depo vs Nexplanon Symptoms - Abnormal vaginal discharge: white  - Missed period: Had abortion 1.5 months ago - Fever: No - Abdominal/Pelvic pain: See above  - Vaginal bleeding: None currently  - Pain during sex: N/A - Rash: No   Has been treated for BV with Metronidazole on multiple occasions with last treatment 06/12/21 with metronidazole gel. Treated for BV x2 this year and trichomonas.   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    OBJECTIVE:  BP (!) 101/58   Pulse 80   Ht '5\' 3"'$  (1.6 m)   Wt 107 lb (48.5 kg)   LMP 04/06/2021   SpO2 99%   Breastfeeding Yes   BMI 18.95 kg/m   General: NAD, pleasant, able to participate in exam Cardiac: RRR, no murmurs auscultated Respiratory: CTAB, normal WOB Abdomen: soft, non-tender, non-distended, normoactive bowel sounds Extremities: warm and well perfused, no edema or cyanosis Skin: warm and dry, no rashes noted Neuro: alert, no obvious focal deficits, speech normal Psych: Normal affect and mood  ASSESSMENT/PLAN:  Routine screening for STI (sexually transmitted infection) Reviewed labs and allergies, will check GC/Chlamydia, Trich, wet prep and will call patient with results. Discussed safe sex practices. Patient's questions answered to their satisfaction. Declined HIV and RPR   Encounter for initial prescription of  contraceptives Depo given, Upreg negative  Follow up 3 month for Nexplanon insertion vs 2nd Depo     After review of chart after visit, pt has history of abnormal pap smear and needs updated. Updated pap at next visit.  NEEDS PAP AT FOLLOW UP   Has h/o abnormal pap (last pap 04/2020-needs new pap 1 year after last)  Orders Placed This Encounter  Procedures   POCT Wet Prep [facility])   POCT urine pregnancy   Meds ordered this encounter  Medications   medroxyPROGESTERone (DEPO-PROVERA) injection 150 mg   Return in about 3 months (around 10/29/2021) for Nexplanon placement . Erskine Emery, MD PGY-2 Family Medicine

## 2021-07-29 NOTE — Assessment & Plan Note (Signed)
Reviewed labs and allergies, will check GC/Chlamydia, Trich, wet prep and will call patient with results. Discussed safe sex practices. Patient's questions answered to their satisfaction. Declined HIV and RPR

## 2021-07-29 NOTE — Patient Instructions (Addendum)
It was great to see you today! Thank you for choosing [facility] for your primary care. Marissa Wright was seen for vaginal discharge.  Today we addressed: BV treatment, I will order to your pharmacy once the results come in  Please follow up in 3 months for nexplanon   If you haven't already, sign up for My Chart to have easy access to your labs results, and communication with your primary care physician.  We are checking some labs today. If they are abnormal, I will call you. If they are normal, I will send you a MyChart message (if it is active) or a letter in the mail. If you do not hear about your labs in the next 2 weeks, please call the office.   You should return to our clinic Return in about 3 months (around 10/29/2021) for Nexplanon placement .  I recommend that you always bring your medications to each appointment as this makes it easy to ensure you are on the correct medications and helps [place] not miss refills when you need them.  Please arrive 15 minutes before your appointment to ensure smooth check in process.  We appreciate your efforts in making this happen.  Please call the clinic at [phone] if your symptoms worsen or you have any concerns.  Thank you for allowing me to participate in your care, Erskine Emery, MD PGY-2 Family Medicine

## 2021-07-29 NOTE — Assessment & Plan Note (Signed)
Depo given, Upreg negative  Follow up 3 month for Nexplanon insertion vs 2nd Depo

## 2021-07-30 ENCOUNTER — Telehealth: Payer: Self-pay

## 2021-07-30 ENCOUNTER — Other Ambulatory Visit: Payer: Self-pay | Admitting: Student

## 2021-07-30 DIAGNOSIS — B9689 Other specified bacterial agents as the cause of diseases classified elsewhere: Secondary | ICD-10-CM

## 2021-07-30 MED ORDER — METRONIDAZOLE 500 MG PO TABS: 500.0000 mg | ORAL_TABLET | Freq: Two times a day (BID) | ORAL | 0 refills | Status: AC

## 2021-07-30 NOTE — Telephone Encounter (Signed)
Patient calls nurse in regards to test results.   Patient reports she saw positive BV. Patient is requesting the pills vs gel.   Advised her of still pending results.   Will forward to provider who saw patient.   [facility] in [place].

## 2021-07-31 LAB — CERVICOVAGINAL ANCILLARY ONLY
Chlamydia: NEGATIVE
Comment: NEGATIVE
Comment: NORMAL
Neisseria Gonorrhea: NEGATIVE

## 2021-10-29 ENCOUNTER — Ambulatory Visit (INDEPENDENT_AMBULATORY_CARE_PROVIDER_SITE_OTHER): Payer: PRIVATE HEALTH INSURANCE

## 2021-10-29 DIAGNOSIS — Z30019 Encounter for initial prescription of contraceptives, unspecified: Secondary | ICD-10-CM

## 2021-10-29 LAB — POCT URINE PREGNANCY: Preg Test, Ur: NEGATIVE

## 2021-10-29 MED ORDER — MEDROXYPROGESTERONE ACETATE 150 MG/ML IM SUSP
150.0000 mg | Freq: Once | INTRAMUSCULAR | Status: AC
  Administered 2021-10-29: 150 mg via INTRAMUSCULAR

## 2021-10-29 NOTE — Progress Notes (Signed)
Patient here today for Depo Provera injection and is not within her dates. Urine pregnancy test obtained and negative.   Last contraceptive appt was 07/29/2021.  Depo given in [place] today.  Site unremarkable & patient tolerated injection.    Next injection due 01/14/2022-01/28/2022.  Reminder card given.

## 2022-01-20 ENCOUNTER — Other Ambulatory Visit (HOSPITAL_COMMUNITY)
Admission: RE | Admit: 2022-01-20 | Discharge: 2022-01-20 | Disposition: A | Discharge status: Home / Self Care | Payer: PRIVATE HEALTH INSURANCE | Source: Ambulatory Visit | Attending: Family Medicine | Admitting: Family Medicine

## 2022-01-20 ENCOUNTER — Ambulatory Visit (INDEPENDENT_AMBULATORY_CARE_PROVIDER_SITE_OTHER): Payer: PRIVATE HEALTH INSURANCE | Admitting: Student

## 2022-01-20 ENCOUNTER — Ambulatory Visit: Payer: PRIVATE HEALTH INSURANCE

## 2022-01-20 ENCOUNTER — Encounter: Payer: Self-pay | Admitting: Student

## 2022-01-20 VITALS — BP 107/66 | HR 78 | Wt 120.5 lb

## 2022-01-20 DIAGNOSIS — Z124 Encounter for screening for malignant neoplasm of cervix: Secondary | ICD-10-CM

## 2022-01-20 DIAGNOSIS — M6289 Other specified disorders of muscle: Secondary | ICD-10-CM | POA: Diagnosis not present

## 2022-01-20 DIAGNOSIS — Z01419 Encounter for gynecological examination (general) (routine) without abnormal findings: Secondary | ICD-10-CM | POA: Diagnosis not present

## 2022-01-20 DIAGNOSIS — Z30019 Encounter for initial prescription of contraceptives, unspecified: Secondary | ICD-10-CM | POA: Diagnosis not present

## 2022-01-20 MED ORDER — MEDROXYPROGESTERONE ACETATE 150 MG/ML IM SUSP
150.0000 mg | Freq: Once | INTRAMUSCULAR | Status: AC
  Administered 2022-01-20: 150 mg via INTRAMUSCULAR

## 2022-01-20 NOTE — Progress Notes (Signed)
    SUBJECTIVE:   CHIEF COMPLAINT / HPI: Obtain pap smear  Pap Smear: Patient is a 27 y.o. female presenting for pap smear.  She states she does not have any discharge, odor or vaginal symptosm.  She is not interested in screening for sexually transmitted infections today. She is on contraception with depo  Last Depo shot was 10/29/2021, due for another one today.   Patient also mentions that she recently had period and that it is uncomfortable to wear tampon. She has no changes to vaginal discharge.  She says that when she sits down or when she is going to the bathroom sometimes she feels like she feels a mass on the inside of the vagina.  She declines any urinary issues such as leakage.  She denies any pain during intercourse except in certain positions.  She has been using boric acid for her BV infections and that she has not had any issues since then.  She declines any STD testing or wet prep today.  PERTINENT  PMH / PSH: None relevant  OBJECTIVE:   BP 107/66   Pulse 78   Wt 120 lb 8 oz (54.7 kg)   LMP 01/06/2022   SpO2 99%   BMI 21.35 kg/m    General: NAD, pleasant, able to participate in exam Respiratory: Normal effort, no obvious respiratory distress Pelvic: VULVA: normal appearing vulva with no masses, tenderness or lesions, VAGINA: Normal appearing vagina with normal color, no lesions, with white discharge present, CERVIX: No lesions, white discharge present. Pap smear performed with cytobrush.  On on bimanual examination no masses palpated with patient bearing down, no cervical motion tenderness or adnexal tenderness/masses  Chaperone Lavell Anchors CMA present for pelvic exam  ASSESSMENT/PLAN:   27 y.o. female here for pap smear.  Has had 1 Pap smear with high-risk HPV and ASCUS and Pap smear after that was normal.  Physical exam significant for whitish discharge.  Patient is not interested in STI screening.   Plan: -F/u pap smear results -Follow-up as needed  Pelvic  floor dysfunction in female Patient has been having some discomfort when sitting down/going to the bathroom.  Does not have any urinary issues/leakage.  I discussed we can try pelvic floor PT and if not improved could consider gynecology referral - Ambulatory referral to Physical Therapy   Gerrit Heck, MD [place]

## 2022-01-20 NOTE — Patient Instructions (Signed)
It was great to see you! Thank you for allowing me to participate in your care!   Our plans for today:  -I have placed a referral for pelvic floor PT -I will let you know what your Pap smear results show  Take care and seek immediate care sooner if you develop any concerns.  Gerrit Heck, MD

## 2022-01-26 LAB — CYTOLOGY - PAP: Diagnosis: NEGATIVE

## 2022-01-27 ENCOUNTER — Other Ambulatory Visit: Payer: Self-pay | Admitting: Student

## 2022-01-28 NOTE — Therapy (Deleted)
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  Rationale for Evaluation and Treatment: Rehabilitation  ONSET DATE: ***  SUBJECTIVE:                                                                                                                                                                                           SUBJECTIVE STATEMENT: *** Fluid intake: {Yes/No:304960894}   PAIN:  Are you having pain? {yes/no:20286} NPRS scale: ***/10 Pain location: {pelvic pain location:27098}  Pain type: {type:313116} Pain description: {PAIN DESCRIPTION:21022940}   Aggravating factors: *** Relieving factors: ***  PRECAUTIONS: None  WEIGHT BEARING RESTRICTIONS: No  FALLS:  Has patient fallen in last 6 months? {fallsyesno:27318}  LIVING ENVIRONMENT: Lives with: {OPRC lives with:25569::"lives with their family"} Lives in: {Lives in:25570} Stairs: {opstairs:27293} Has following equipment at home: {Assistive devices:23999}  OCCUPATION: ***  PLOF: {PLOF:24004}  PATIENT GOALS: ***  PERTINENT HISTORY:  none Sexual abuse:  {Yes/No:304960894}  BOWEL MOVEMENT: Pain with bowel movement: {yes/no:20286} Type of bowel movement:{PT BM type:27100} Fully empty rectum: {Yes/No:304960894} Leakage: {Yes/No:304960894} Pads: {Yes/No:304960894} Fiber supplement: {Yes/No:304960894}  URINATION: Pain with urination: {yes/no:20286} Fully empty bladder: {Yes/No:304960894} Stream: {PT urination:27102} Urgency: {Yes/No:304960894} Frequency: *** Leakage: {PT leakage:27103} Pads: {Yes/No:304960894}  INTERCOURSE: Pain with intercourse: {pain with intercourse PA:27099} Ability to have vaginal penetration:  {Yes/No:304960894} Climax: *** Marinoff Scale: ***/3  PREGNANCY: Vaginal deliveries *** Tearing {Yes***/No:304960894} C-section deliveries *** Currently pregnant {Yes***/No:304960894}  PROLAPSE: {PT prolapse:27101}   OBJECTIVE:   DIAGNOSTIC FINDINGS:  ***  PATIENT SURVEYS:  {rehab surveys:24030}  PFIQ-7 ***  COGNITION: Overall cognitive status: {cognition:24006}     SENSATION: Light touch: {intact/deficits:24005} Proprioception: {intact/deficits:24005}  MUSCLE LENGTH: Hamstrings: Right *** deg; Left *** deg Thomas test: Right *** deg; Left *** deg  LUMBAR SPECIAL TESTS:  {lumbar special test:25242}  FUNCTIONAL TESTS:  {Functional tests:24029}  GAIT: Distance walked: *** Assistive device utilized: {Assistive devices:23999} Level of assistance: {Levels of assistance:24026} Comments: ***  POSTURE: {posture:25561}  PELVIC ALIGNMENT:  LUMBARAROM/PROM:  A/PROM A/PROM  eval  Flexion   Extension   Right lateral flexion   Left lateral flexion   Right rotation   Left rotation    (Blank rows = not tested)  LOWER EXTREMITY ROM:  {AROM/PROM:27142} ROM Right eval Left eval  Hip flexion    Hip extension    Hip abduction    Hip adduction    Hip internal rotation    Hip external rotation    Knee flexion    Knee extension    Ankle dorsiflexion    Ankle plantarflexion    Ankle  inversion    Ankle eversion     (Blank rows = not tested)  LOWER EXTREMITY MMT:  MMT Right eval Left eval  Hip flexion    Hip extension    Hip abduction    Hip adduction    Hip internal rotation    Hip external rotation    Knee flexion    Knee extension    Ankle dorsiflexion    Ankle plantarflexion    Ankle inversion    Ankle eversion     PALPATION:   General  ***                External Perineal Exam ***                             Internal Pelvic Floor ***  Patient confirms identification and approves PT to assess internal pelvic floor and treatment {yes/no:20286}  PELVIC MMT:   MMT eval  Vaginal   Internal Anal Sphincter   External Anal Sphincter   Puborectalis   Diastasis Recti   (Blank rows = not tested)        TONE: ***  PROLAPSE: ***  TODAY'S TREATMENT:                                                                                                                              DATE: ***  EVAL ***   PATIENT EDUCATION:  Education details: *** Person educated: {Person educated:25204} Education method: {Education Method:25205} Education comprehension: {Education Comprehension:25206}  HOME EXERCISE PROGRAM: ***  ASSESSMENT:  CLINICAL IMPRESSION: Patient is a *** y.o. *** who was seen today for physical therapy evaluation and treatment for ***.   OBJECTIVE IMPAIRMENTS: {opptimpairments:25111}.   ACTIVITY LIMITATIONS: {activitylimitations:27494}  PARTICIPATION LIMITATIONS: {participationrestrictions:25113}  PERSONAL FACTORS: {Personal factors:25162} are also affecting patient's functional outcome.   REHAB POTENTIAL: {rehabpotential:25112}  CLINICAL DECISION MAKING: {clinical decision making:25114}  EVALUATION COMPLEXITY: {Evaluation complexity:25115}   GOALS: Goals reviewed with patient? {yes/no:20286}  SHORT TERM GOALS: Target date: ***  *** Baseline: Goal status: {GOALSTATUS:25110}  2.  *** Baseline:  Goal status:  {GOALSTATUS:25110}  3.  *** Baseline:  Goal status: {GOALSTATUS:25110}  4.  ***  Baseline:  Goal status: {GOALSTATUS:25110}  5.  *** Baseline:  Goal status: {GOALSTATUS:25110}  6.  *** Baseline:  Goal status: {GOALSTATUS:25110}  LONG TERM GOALS: Target date: ***  *** Baseline:  Goal status: {GOALSTATUS:25110}  2.  *** Baseline:  Goal status: {GOALSTATUS:25110}  3.  *** Baseline:  Goal status: {GOALSTATUS:25110}  4.  *** Baseline:  Goal status: {GOALSTATUS:25110}  5.  *** Baseline:  Goal status: {GOALSTATUS:25110}  6.  *** Baseline:  Goal status: {GOALSTATUS:25110}  PLAN:  PT FREQUENCY: {rehab frequency:25116}  PT DURATION: {rehab duration:25117}  PLANNED INTERVENTIONS: {rehab planned interventions:25118::"Therapeutic exercises","Therapeutic activity","Neuromuscular re-education","Balance training","Gait training","Patient/Family education","Self Care","Joint mobilization"}  PLAN FOR NEXT SESSION: ***   Demyan Fugate, PT 01/28/2022, 10:25 AM

## 2022-01-29 ENCOUNTER — Ambulatory Visit: Payer: PRIVATE HEALTH INSURANCE | Admitting: Physical Therapy

## 2022-02-01 ENCOUNTER — Ambulatory Visit (INDEPENDENT_AMBULATORY_CARE_PROVIDER_SITE_OTHER): Payer: PRIVATE HEALTH INSURANCE | Admitting: Student

## 2022-02-01 VITALS — BP 104/61 | HR 66 | Temp 99.3°F | Ht 63.0 in | Wt 119.5 lb

## 2022-02-01 DIAGNOSIS — A084 Viral intestinal infection, unspecified: Secondary | ICD-10-CM | POA: Diagnosis not present

## 2022-02-01 MED ORDER — ONDANSETRON 8 MG PO TBDP: 8.0000 mg | ORAL_TABLET | Freq: Three times a day (TID) | ORAL | 0 refills | Status: DC | PRN

## 2022-02-01 NOTE — Patient Instructions (Signed)
It was great to see you! Thank you for allowing me to participate in your care!   Our plans for today:  - I am sending in some zofran to take as needed - toast, increasing fluids doing pedialyte or gatorade and increasing water  Take care and seek immediate care sooner if you develop any concerns.  Gerrit Heck, MD

## 2022-02-01 NOTE — Progress Notes (Signed)
    SUBJECTIVE:   CHIEF COMPLAINT / HPI: Nausea, vomiting, diarrhea  Patient says that her child started having diarrhea earlier last week.  She was brought into clinic and told she had viral gastroenteritis and was given Pedialyte.  Patient says that she started having symptoms on Saturday of vomiting and diarrhea.  She is still having diarrhea but she has not vomited today but is still having significant nausea.  She says that her diarrhea is improving.  She has not had any fevers.  She denies any blood in her stools and has had NBNB emesis.  She took some Phenergan that she had from her pregnancy on Saturday but it made her very tired.  She has been eating less but has been drinking some fluids.  PERTINENT  PMH / PSH:   OBJECTIVE:   BP 104/61   Pulse 66   Temp 99.3 F (37.4 C) (Oral)   Ht '5\' 3"'$  (1.6 m)   Wt 119 lb 8 oz (54.2 kg)   LMP 01/11/2022   SpO2 100%   BMI 21.17 kg/m   General: NAD, awake, alert, responsive to questions Head: Normocephalic atraumatic, mildly chapped lips CV: Regular rate and rhythm no murmurs rubs or gallops, cap refill less than 2 seconds Respiratory: Clear to ausculation bilaterally, no wheezes rales or crackles, chest rises symmetrically,  no increased work of breathing Abdomen: Soft, non-tender, non-distended, normoactive bowel sounds  Extremities: Moves upper and lower extremities freely  ASSESSMENT/PLAN:   Viral gastroenteritis Gastroenteritis that is starting to resolve.  Patient still having nausea I will treat with an antiemetic for her.  She is keeping up her hydration status currently but I discussed to increase fluids is much as possible. - ondansetron (ZOFRAN-ODT) 8 MG disintegrating tablet; Take 1 tablet (8 mg total) by mouth every 8 (eight) hours as needed for nausea or vomiting.  Dispense: 20 tablet; Refill: 0  -Work note provided -ED/return precautions discussed   Gerrit Heck, MD [place]

## 2022-04-22 ENCOUNTER — Ambulatory Visit: Payer: PRIVATE HEALTH INSURANCE

## 2022-04-22 DIAGNOSIS — Z30019 Encounter for initial prescription of contraceptives, unspecified: Secondary | ICD-10-CM

## 2022-04-22 MED ORDER — MEDROXYPROGESTERONE ACETATE 150 MG/ML IM SUSY
150.0000 mg | PREFILLED_SYRINGE | Freq: Once | INTRAMUSCULAR | Status: AC
  Administered 2022-04-22: 150 mg via INTRAMUSCULAR

## 2022-04-22 NOTE — Progress Notes (Signed)
Patient here today for Depo Provera injection and is within her dates.    Last contraceptive appt was 01/20/2022.  Depo given in LUOQ today. Site unremarkable & patient tolerated injection.    Next injection due 07/08/2022-07/22/2022.Marland Kitchen    Reminder card given.

## 2022-05-11 NOTE — Progress Notes (Signed)
I have reviewed this visit and agree with the documentation.   

## 2022-07-08 ENCOUNTER — Ambulatory Visit: Payer: PRIVATE HEALTH INSURANCE

## 2022-07-08 DIAGNOSIS — Z3042 Encounter for surveillance of injectable contraceptive: Secondary | ICD-10-CM

## 2022-07-08 MED ORDER — MEDROXYPROGESTERONE ACETATE 150 MG/ML IM SUSY
150.0000 mg | PREFILLED_SYRINGE | Freq: Once | INTRAMUSCULAR | Status: AC
  Administered 2022-07-08: 150 mg via INTRAMUSCULAR

## 2022-07-08 NOTE — Progress Notes (Signed)
Patient here today for Depo Provera injection and is within her dates.    Last contraceptive appt was 01/20/2022  Depo given in RUOQ today.  Site unremarkable & patient tolerated injection.    Next injection due 09/23/22-10/07/22.  Reminder card given.    Veronda Prude, RN

## 2022-07-13 ENCOUNTER — Encounter: Payer: Self-pay | Admitting: Student

## 2022-07-13 ENCOUNTER — Ambulatory Visit: Payer: PRIVATE HEALTH INSURANCE | Admitting: Student

## 2022-07-13 VITALS — BP 121/73 | HR 70 | Ht 63.0 in | Wt 120.2 lb

## 2022-07-13 DIAGNOSIS — B349 Viral infection, unspecified: Secondary | ICD-10-CM | POA: Insufficient documentation

## 2022-07-13 MED ORDER — ONDANSETRON 8 MG PO TBDP: 8.0000 mg | ORAL_TABLET | Freq: Three times a day (TID) | ORAL | 0 refills | Status: DC | PRN

## 2022-07-13 NOTE — Assessment & Plan Note (Signed)
Likely obtained from daughter at home.  No antibiotics indicated.  Recommended over-the-counter remedies for sore throat, Zofran as needed for nausea, Tylenol and ibuprofen and hydration.  Work note provided from 6/30 to 7/3.

## 2022-07-13 NOTE — Progress Notes (Signed)
  SUBJECTIVE:   CHIEF COMPLAINT / HPI:   Sore throat: Endorses that her daughter got sick on Friday.  She then the following day had headache, sore throat, brief nausea, excess fatigue.  Her symptoms have been improving but since she has needed to be out of work since Sunday, she wanted to be checked and obtain work note for being sick.  She is attempting to stay hydrated and has been using Tylenol as needed for any discomfort.  She also tried an over-the-counter nausea medication provided by her sister but does not know what is called.  Denies vomiting, abdominal pain, fever.  PERTINENT  PMH / PSH: N/A  Patient Care Team: Levin Erp, MD as PCP - General (Family Medicine) OBJECTIVE:  BP 121/73   Pulse 70   Ht 5\' 3"  (1.6 m)   Wt 120 lb 3.2 oz (54.5 kg)   LMP 06/12/2022 (Approximate)   SpO2 100%   BMI 21.29 kg/m  Physical Exam Constitutional:      Appearance: Normal appearance.  HENT:     Right Ear: Tympanic membrane normal.     Left Ear: Tympanic membrane normal.     Mouth/Throat:     Mouth: Mucous membranes are moist.     Pharynx: Oropharynx is clear. No oropharyngeal exudate or posterior oropharyngeal erythema.  Neck:     Comments: Anterior Cardiovascular:     Rate and Rhythm: Normal rate and regular rhythm.     Heart sounds: Normal heart sounds.  Pulmonary:     Effort: Pulmonary effort is normal.     Breath sounds: Normal breath sounds.  Abdominal:     General: Abdomen is flat.     Palpations: Abdomen is soft.  Lymphadenopathy:     Cervical: Cervical adenopathy present.  Skin:    Capillary Refill: Capillary refill takes less than 2 seconds.  Neurological:     Mental Status: She is alert.  Psychiatric:        Mood and Affect: Mood normal.        Behavior: Behavior normal.    ASSESSMENT/PLAN:  Viral illness Assessment & Plan: Likely obtained from daughter at home.  No antibiotics indicated.  Recommended over-the-counter remedies for sore throat, Zofran as  needed for nausea, Tylenol and ibuprofen and hydration.  Work note provided from 6/30 to 7/3.  Orders: -     Ondansetron; Take 1 tablet (8 mg total) by mouth every 8 (eight) hours as needed for nausea or vomiting.  Dispense: 10 tablet; Refill: 0  Return if symptoms worsen or fail to improve. Shelby Mattocks, DO 07/13/2022, 2:24 PM PGY-3, [facility]

## 2022-07-13 NOTE — Patient Instructions (Signed)
It was great to see you today! Thank you for choosing [facility] for your primary care. Marissa Wright was seen for viral illness.  Today we addressed: Please focus on hydration.  You may use over-the-counter remedies as needed for any discomfort.  If you haven't already, sign up for My Chart to have easy access to your labs results, and communication with your primary care physician.  Call the clinic at [phone] if your symptoms worsen or you have any concerns. Please arrive 15 minutes before your appointment to ensure smooth check in process.  We appreciate your efforts in making this happen.  Thank you for allowing me to participate in your care, Shelby Mattocks, DO 07/13/2022, 2:20 PM PGY-3, [facility]

## 2022-09-17 ENCOUNTER — Ambulatory Visit (INDEPENDENT_AMBULATORY_CARE_PROVIDER_SITE_OTHER): Payer: PRIVATE HEALTH INSURANCE | Admitting: Student

## 2022-09-17 VITALS — BP 101/53 | HR 62 | Ht 62.0 in | Wt 124.6 lb

## 2022-09-17 DIAGNOSIS — R41840 Attention and concentration deficit: Secondary | ICD-10-CM | POA: Insufficient documentation

## 2022-09-17 NOTE — Progress Notes (Signed)
  SUBJECTIVE:   CHIEF COMPLAINT / HPI:   Requesting ADHD evaluation. She is back in school. She had trouble focusing when in highschool. She never got tested in childhood. Graduated with Bs-Ds. If there is something she isn't interested in, she has trouble focusing. She will drift to her phone and will continue to scroll. She has the most trouble reading and doing her school work. When cleaning, she will jump from activity to activity without finishing each task.   PERTINENT  PMH / PSH: anxiety, postpartum depression  OBJECTIVE:  BP (!) 101/53   Pulse 62   Ht 5\' 2"  (1.575 m)   Wt 124 lb 9.6 oz (56.5 kg)   SpO2 99%   BMI 22.79 kg/m  Gen: well-appearing, NAD Psych: normal mood and affect  ASSESSMENT/PLAN:  Difficulty concentrating Assessment & Plan: Counseled on attempting to decrease social media as distractor.  Referral to psychiatry for ADHD evaluation.  Of note she also sees a therapist who may be useful in this evaluation.  Orders: -     Ambulatory referral to Psychiatry  Shelby Mattocks, DO 09/17/2022, 10:14 AM PGY-3, [facility]

## 2022-09-17 NOTE — Assessment & Plan Note (Signed)
Counseled on attempting to decrease social media as distractor.  Referral to psychiatry for ADHD evaluation.  Of note she also sees a therapist who may be useful in this evaluation.

## 2022-09-17 NOTE — Patient Instructions (Signed)
It was great to see you today! Thank you for choosing [facility] for your primary care.  Today we addressed: I have placed a referral for you to be evaluated for ADHD by psychiatry.  This may take some time to be scheduled.  In the meantime, I would recommend attempting to decrease social media in your life as that can often serve as a great distraction for people.  If you haven't already, sign up for My Chart to have easy access to your labs results, and communication with your primary care physician.  Please arrive 15 minutes before your appointment to ensure smooth check in process.  We appreciate your efforts in making this happen.  Thank you for allowing me to participate in your care, Shelby Mattocks, DO 09/17/2022, 9:52 AM PGY-3, [facility]

## 2022-09-27 ENCOUNTER — Ambulatory Visit: Payer: PRIVATE HEALTH INSURANCE

## 2022-09-29 ENCOUNTER — Ambulatory Visit (INDEPENDENT_AMBULATORY_CARE_PROVIDER_SITE_OTHER): Payer: PRIVATE HEALTH INSURANCE

## 2022-09-29 DIAGNOSIS — Z30019 Encounter for initial prescription of contraceptives, unspecified: Secondary | ICD-10-CM

## 2022-09-29 MED ORDER — MEDROXYPROGESTERONE ACETATE 150 MG/ML IM SUSY
150.0000 mg | PREFILLED_SYRINGE | Freq: Once | INTRAMUSCULAR | Status: AC
  Administered 2022-09-29: 150 mg via INTRAMUSCULAR

## 2022-09-29 NOTE — Progress Notes (Signed)
Patient here today for Depo Provera injection and is within her dates.     Last contraceptive appt was 01/20/2022   Depo given in RUOQ today.  Site unremarkable & patient tolerated injection.     Next injection due 12/15/2022-12/29/2022.  Reminder card given.

## 2022-10-08 IMAGING — US US MFM OB COMP +14 WKS
1 series · 13 of 28 positions shown · non-contrast
Comparison: none

[Series 1: us mfm ob comp +14 wks · 118 acquisitions, 13 frames shown]
[im 5/118]
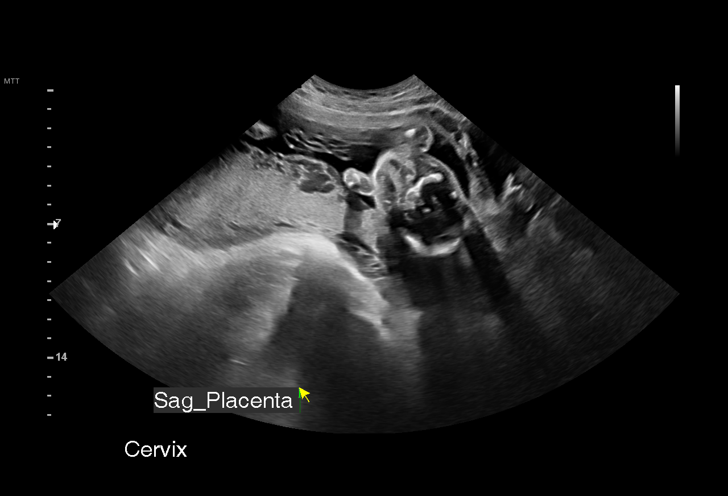
[im 14/118]
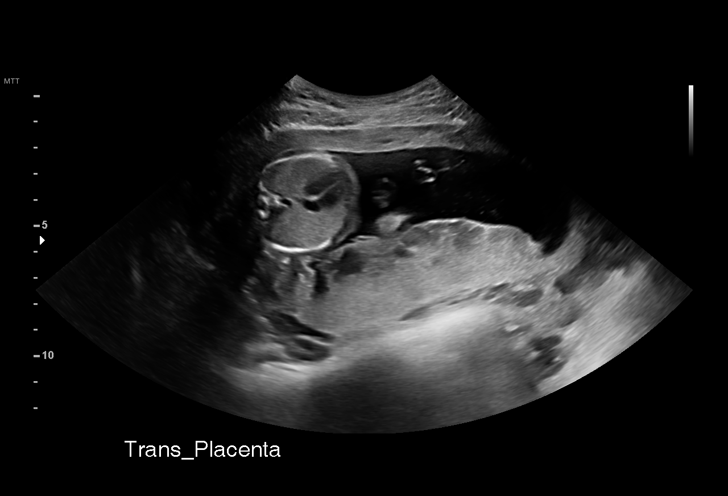
[im 22/118]
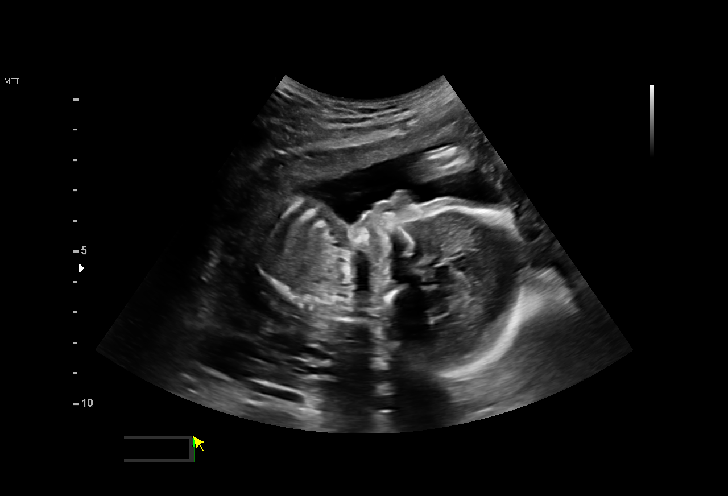
[im 31/118]
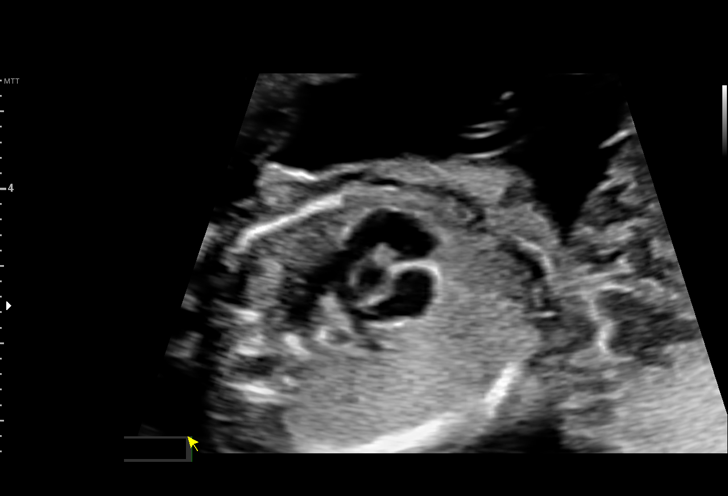
[im 40/118]
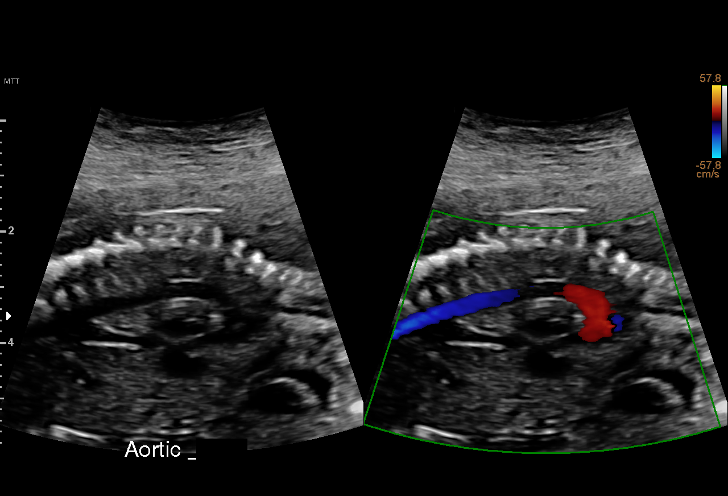
[im 48/118]
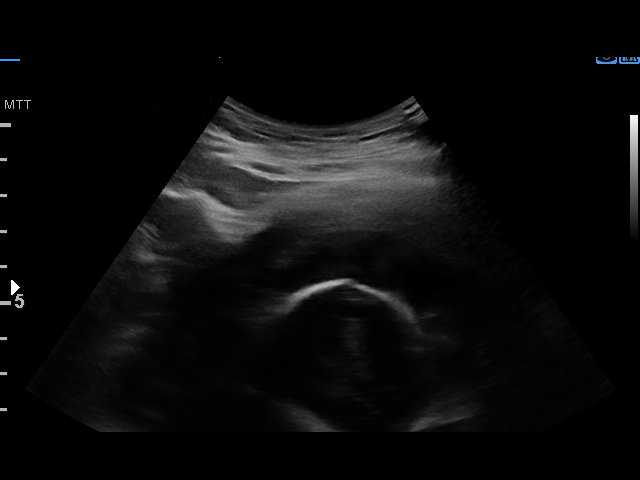
[im 61/118]
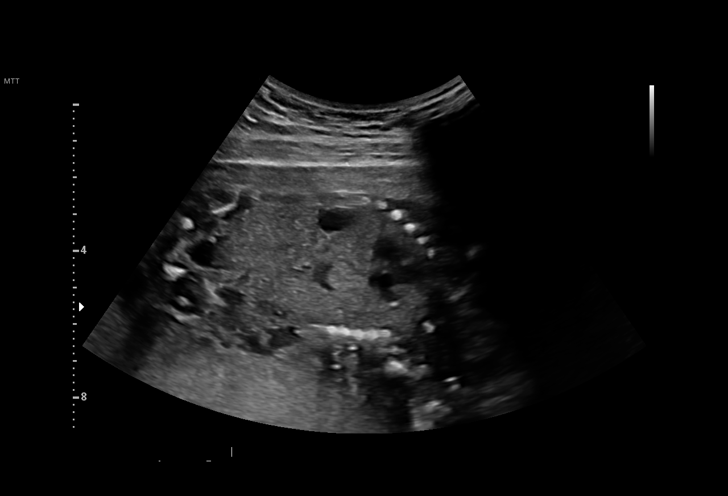
[im 70/118]
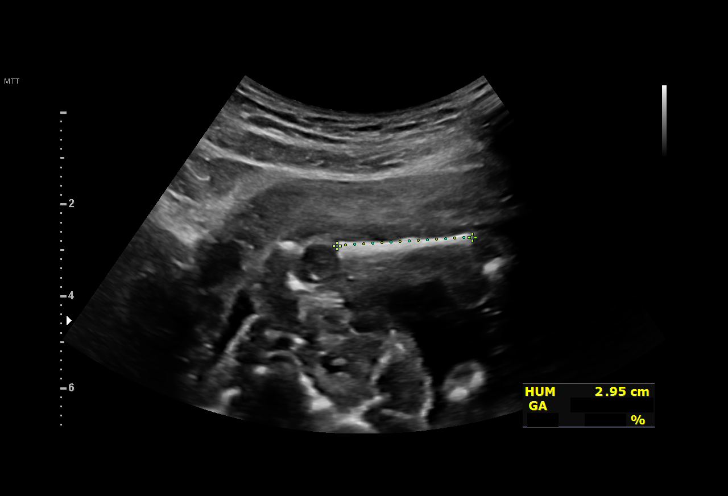
[im 79/118]
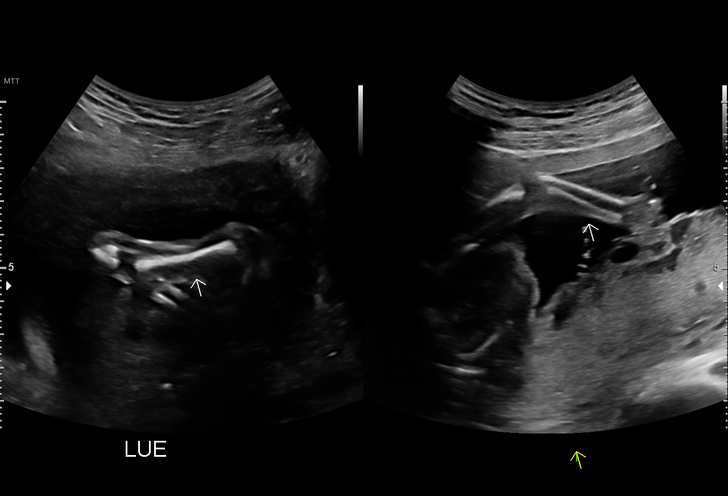
[im 87/118]
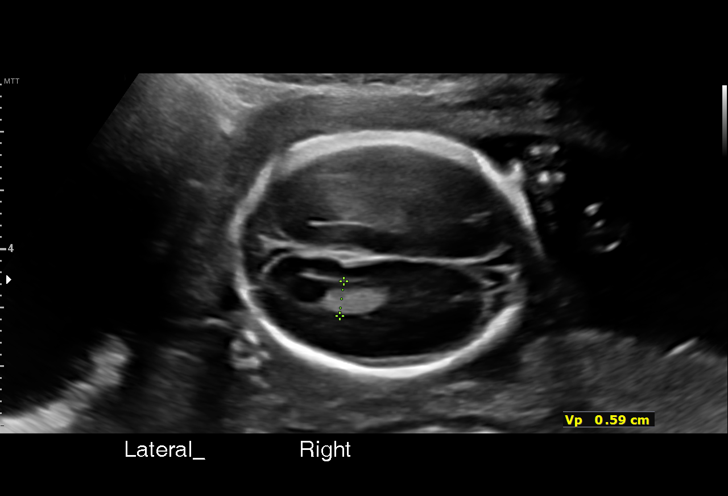
[im 96/118]
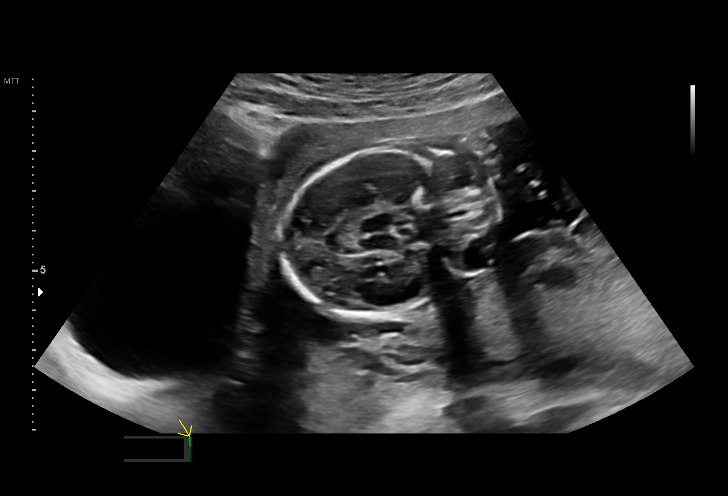
[im 105/118]
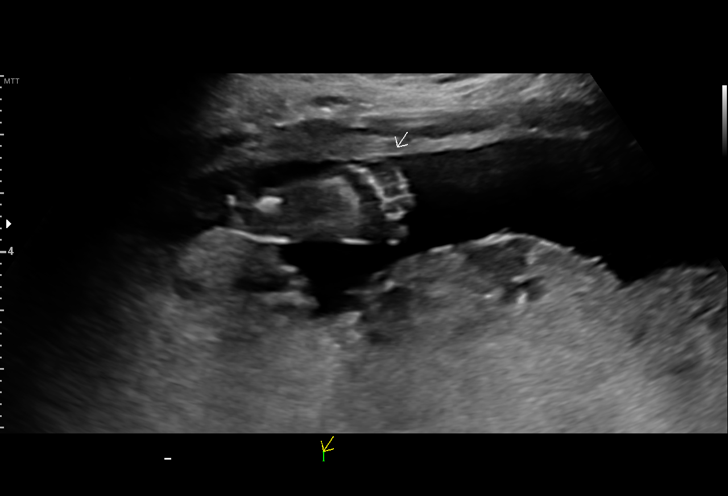
[im 113/118]
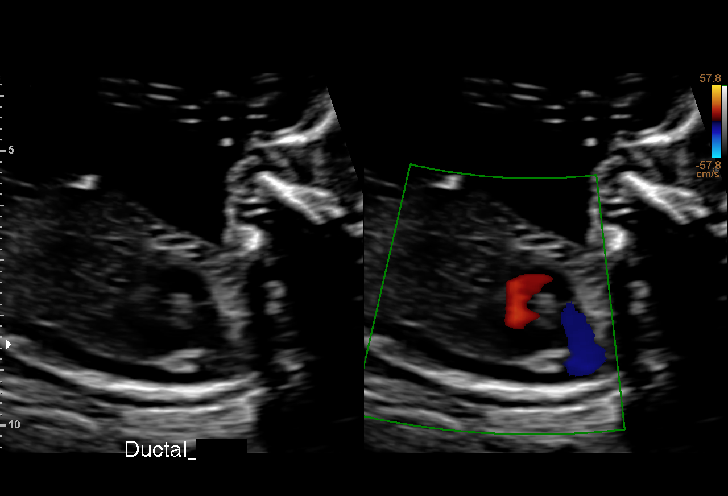

[13 of 28 positions shown; findings below may reference images not displayed]

SELWYN

Indications

 19 weeks gestation of pregnancy
 Encounter for antenatal screening for
 malformations
 Rh negative state in antepartum
 Negative First Trimester Screen/ Negative
 AFP Quad Screen
 Other mental disorder complicating
 pregnancy, unspecified trimester
Fetal Evaluation

 Num Of Fetuses:         1
 Fetal Heart Rate(bpm):  153
 Cardiac Activity:       Observed
 Presentation:           Cephalic
 Placenta:               Posterior
 P. Cord Insertion:      Visualized, central

 Amniotic Fluid
 AFI FV:      Within normal limits

                             Largest Pocket(cm)

Biometry

 BPD:      41.2  mm     G. Age:  18w 3d         18  %    CI:         68.8   %    70 - 86
                                                         FL/HC:      19.2   %    16.1 -
 HC:      158.7  mm     G. Age:  18w 5d         18  %    HC/AC:      1.10        1.09 -
 AC:      144.1  mm     G. Age:  19w 5d         61  %    FL/BPD:     74.0   %
 FL:       30.5  mm     G. Age:  19w 3d         49  %    FL/AC:      21.2   %    20 - 24
 HUM:      29.9  mm     G. Age:  19w 6d         65  %
 CER:      20.6  mm     G. Age:  19w 5d         68  %
 NFT:       4.0  mm
 LV:        5.9  mm
 CM:        3.2  mm

 Est. FW:     295  gm    0 lb 10 oz      57  %
OB History

 Blood Type:   A-
 Gravidity:    1         Term:   0        Prem:   0        SAB:   0
 TOP:          0       Ectopic:  0        Living: 0
Gestational Age

 LMP:           20w 4d        Date:  12/01/19                 EDD:   09/06/20
 U/S Today:     19w 1d                                        EDD:   09/16/20
 Best:          19w 2d     Det. By:  Early Ultrasound         EDD:   09/15/20
                                     (01/22/20)
Anatomy

 Cranium:               Appears normal         Aortic Arch:            Appears normal
 Cavum:                 Appears normal         Ductal Arch:            Appears normal
 Ventricles:            Appears normal         Diaphragm:              Appears normal
 Choroid Plexus:        Appears normal         Stomach:                Appears normal, left
                                                                       sided
 Cerebellum:            Appears normal         Abdomen:                Appears normal
 Posterior Fossa:       Appears normal         Abdominal Wall:         Appears nml (cord
                                                                       insert, abd wall)
 Nuchal Fold:           Appears normal         Cord Vessels:           Appears normal (3
                                                                       vessel cord)
 Face:                  Appears normal         Kidneys:                Appear normal
                        (orbits and profile)
 Lips:                  Appears normal         Bladder:                Appears normal
 Thoracic:              Appears normal         Spine:                  Appears normal
 Heart:                 Appears normal         Upper Extremities:      Appears normal
                        (4CH, axis, and
                        situs)
 RVOT:                  Appears normal         Lower Extremities:      Appears normal
 LVOT:                  Appears normal

 Other:  Parents do not wish to know sex of fetus. Fetus appears to be female.
         Nasal bone, 3VV, 3VT, VC, and Lenses visualized. Heels/feet and
         open hands/5th digits visualized.
Cervix Uterus Adnexa

 Cervix
 Length:           3.47  cm.
 Normal appearance by transabdominal scan.

 Uterus
 No abnormality visualized.
 Right Ovary
 Within normal limits.

 Left Ovary
 Within normal limits.

 Cul De Sac
 No free fluid seen.

 Adnexa
 No abnormality visualized.
Impression

 Ms. Charkaoui is a G1P0 with an uncomplicated pregnancy. She
 had neg GORAN and AFP quad.
 Single intrauterine pregnancy here for a detailed anatomy
 Normal anatomy with measurements consistent with dates
 There is good fetal movement and amniotic fluid volume

Recommendations

 Follow up as clinically indicated.

## 2022-12-15 ENCOUNTER — Ambulatory Visit: Payer: No Typology Code available for payment source

## 2022-12-21 NOTE — Progress Notes (Unsigned)
    SUBJECTIVE:   CHIEF COMPLAINT / HPI:   Last depo 09/29/2022.  Presents requesting to switch from Depo-Provera to an oral contraceptive. They have been on Depo-Provera since July 2023 and have liked the weight gain associated with its use, going from 105 to 120 pounds. However, due to concerns about ongoing lawsuits and long-term use, they wish to switch back to an oral contraceptive. They previously used oral contraceptives for three years without issue until discontinuing use, which resulted in pregnancy. They recall experiencing nausea with Sprintec, but did well on a different formulation. They deny a history of migraines with aura, clots in the legs or lungs, and tobacco use, but do admit to occasional marijuana use. They also report a history of heavy, painful periods and would like to use the oral contraceptive to control their menstrual cycle. They express concern about potential weight loss with the switch and inquire about options for weight gain if necessary. They report a good appetite and a family history of larger body types, with the exception of their father. They deny any recent thyroid testing.  PERTINENT  PMH / PSH: reviewed  OBJECTIVE:   BP 115/60   Pulse 72   Ht 5\' 2"  (1.575 m)   Wt 121 lb 12.8 oz (55.2 kg)   SpO2 99%   BMI 22.28 kg/m   General: Well appearing, NAD, awake, alert, responsive to questions Head: Normocephalic atraumatic Respiratory:  chest rises symmetrically,  no increased work of breathing  ASSESSMENT/PLAN:   Assessment & Plan Encounter for other contraceptive management Patient on Depo-Provera since July 2023, wishes to switch to oral contraceptive pill due to concerns about long-term use of Depo-Provera. Previous history of nausea with Sprintec. Last oral contraceptive pill was likely [place] or [place]. -Switch to Apri oral contraceptive pill - 3 months continuous use with a week off to control heavy and painful periods  Weight gain due to  medication Patient reports weight gain on Depo-Provera and is concerned about potential weight loss with switch to oral contraceptive pill. Patient has good appetite and no known thyroid issues. -Monitor weight over the next 3 months. -Consider Ensure Plus for additional caloric intake if needed. -Consider thyroid function test if weight loss occurs after three months    Levin Erp, MD [facility]

## 2022-12-22 ENCOUNTER — Ambulatory Visit (INDEPENDENT_AMBULATORY_CARE_PROVIDER_SITE_OTHER): Payer: No Typology Code available for payment source | Admitting: Student

## 2022-12-22 ENCOUNTER — Encounter: Payer: Self-pay | Admitting: Student

## 2022-12-22 VITALS — BP 115/60 | HR 72 | Ht 62.0 in | Wt 121.8 lb

## 2022-12-22 DIAGNOSIS — Z308 Encounter for other contraceptive management: Secondary | ICD-10-CM | POA: Diagnosis not present

## 2022-12-22 DIAGNOSIS — T50905A Adverse effect of unspecified drugs, medicaments and biological substances, initial encounter: Secondary | ICD-10-CM

## 2022-12-22 DIAGNOSIS — R635 Abnormal weight gain: Secondary | ICD-10-CM

## 2022-12-22 MED ORDER — DESOGESTREL-ETHINYL ESTRADIOL 0.15-30 MG-MCG PO TABS: 1.0000 | ORAL_TABLET | Freq: Every day | ORAL | 2 refills | Status: DC

## 2022-12-22 NOTE — Patient Instructions (Addendum)
It was great to see you! Thank you for allowing me to participate in your care!   Our plans for today:  - I have sent birth control pill in for you and we will stop depo injection - let's see how your weight does off of the depo, if decreasing we can get thyroid tests as well - Weight Gain - Ensure Plus can help as well  Take care and seek immediate care sooner if you develop any concerns.  Levin Erp, MD

## 2023-06-21 ENCOUNTER — Encounter: Payer: Self-pay | Admitting: *Deleted

## 2023-08-16 ENCOUNTER — Other Ambulatory Visit: Payer: Self-pay

## 2023-08-16 DIAGNOSIS — Z308 Encounter for other contraceptive management: Secondary | ICD-10-CM

## 2023-08-16 MED ORDER — DESOGESTREL-ETHINYL ESTRADIOL 0.15-30 MG-MCG PO TABS: 1.0000 | ORAL_TABLET | Freq: Every day | ORAL | 2 refills | Status: AC

## 2023-08-16 NOTE — Telephone Encounter (Signed)
 Chart reviewed  -Fairy Amy, MD
